# Patient Record
Sex: Female | Born: 1995 | Race: Black or African American | Hispanic: No | Marital: Single | Smoking: Never smoker
Health system: Southern US, Community
[De-identification: ages and names within clinical notes are randomized; demographics above are authoritative.]

## PROBLEM LIST (undated history)

## (undated) DIAGNOSIS — F909 Attention-deficit hyperactivity disorder, unspecified type: Secondary | ICD-10-CM

## (undated) DIAGNOSIS — Z975 Presence of (intrauterine) contraceptive device: Secondary | ICD-10-CM

## (undated) DIAGNOSIS — K219 Gastro-esophageal reflux disease without esophagitis: Secondary | ICD-10-CM

## (undated) HISTORY — DX: Attention-deficit hyperactivity disorder, unspecified type: F90.9

## (undated) HISTORY — DX: Gastro-esophageal reflux disease without esophagitis: K21.9

## (undated) HISTORY — DX: Presence of (intrauterine) contraceptive device: Z97.5

---
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
## 2013-03-06 ENCOUNTER — Encounter: Payer: Self-pay | Admitting: Family Medicine

## 2013-03-06 ENCOUNTER — Ambulatory Visit (INDEPENDENT_AMBULATORY_CARE_PROVIDER_SITE_OTHER): Payer: Medicaid Other | Admitting: Family Medicine

## 2013-03-06 VITALS — BP 128/94 | HR 97 | Ht 59.0 in | Wt 159.0 lb

## 2013-03-06 DIAGNOSIS — M542 Cervicalgia: Secondary | ICD-10-CM | POA: Insufficient documentation

## 2013-03-06 MED ORDER — NAPROXEN 500 MG PO TABS: 500.0000 mg | ORAL_TABLET | Freq: Two times a day (BID) | ORAL | Status: DC

## 2013-03-06 NOTE — Progress Notes (Signed)
Subjective:     Patient ID: Emily Hodges, female   DOB: [birth year], 17 y.o.   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 She states that her back pain began Jan 2014 and lasted for 1.5 mos and was relieved until the most recent episode for which she visits today. She describes her pain as a dull ache that is constant throughout the day, rating it an 8/10 on pain scale. The pain does not affect her daily activities in any way and has not changed in character since it began. She has tried taking ibuprofen and using heating pads with limited success. She notes that when she does not wear a sports bra her pain worsens. Additionally, a family Hx of upper back pain attributed to large breast size is reported. Since June 2013 to now she has gone from a D to DDD in cup size. Pt is particularly concerned that her back pain is related to her breast size. Pt denies any recent trauma.  Attending Note: Patient seen and examined by me together with Donnal Moat, [employer]-MS3, I have reviewed and agree with his documentation.  Patient presents today with complaint of upper back pain across both shoulders, worse when she lifts arms over head.  No recent trauma, no repetitive overuse of UEs.  Had similar pain in Jan 2014 which resolved after about 1-1/2 months, then recurred about 3 weeks ago without clear trigger.  Heat pads and ibuprofen intermittently have helped somewhat.  No radiating pain down arms; no focal shoulder pain.  She has noticed some increase in her bra size and believes that this may be contributing to the pain. JB  Review of Systems Consitutional: No fevers, HA, changes in ROM of back, neck, or upper extremities (shoulder, arms). Rest of ROS as per HPI Reviewed ROS and agree. JB    Objective:   Physical Exam Gen: No apparent acute distress. A&Ox3. Pt found sitting upright. Speaks in full sentences and answers questions appropriately.  MSK:  Normal tone throughout. Strength 5/5 in upper extremities and hand. Biceps and Triceps reflexes 2+ bilaterally. Full ROM of upper extremities and neck. Flexion of shoulders above head causes paraspinal pain in upper back. Likewise, a similar pain is elicited on flexion and extension of neck while stabilizing C7, but not with neck rotation. Testing for shoulder impingement using "thumbs-down" method with external resistance and gravity elicits back pain but not shoulder pain. Pain on palpation in the region of the traps, supra/infrascapularis and paraspinal muscles.  Neuro: CN XI intact.   EXAM: Well appearing, no apparent distress. HEENT Neck supple. Full active and passive ROM of neck (flex/extension, R/L rotation, sidebending). No point tenderness over vertebral processes in C- or T-spine;she does have tenderness along paraspinous mm along c-spine.  Full active and passive ROM of both shoulders. Full ROm internal rotation both shoulders. Negative Spurlings test bilat.  NEURO: Sensation in both UEs/hands is symmetric bilaterally. Handgrip strength is full and symmetric bilat.  Biceps reflexes 1-2+ bilat and symmetric.  EXTS: No atrophy of thenar or hypothenar mm. JB  Problem List: 1. Upper back pain    Assessment:     This is an otherwise healthy 17 yo female with a family history of upper back pain related to large breasts, experiencing upper back pain that is likely musculoskeletal in nature.    Plan:     1. Upper back pain - Diff Dx for upper  back pain for this pt includes pure MSK involvement, cervical radiculopathy, or possible shoulder impingement syndrome (SIS). Negative "thumbs down" test and full ROM without limits in daily activities makes SIS less likely. Additionally, normal reflexes, strength, and tone of upper extremities places low on differential. Because of the positive family Hx, enlargement of breasts, and pain seemingly limited to the muscles of the upper back, pt will be  treated for MSK pain. For now the pt will be treated conservatively by using Naproxen 500 mg bid for 7-10 days. Pt will f/u in 2 wks with PCP to evaluate improvement of pain. If pain persists, physical therapy and/or lidoderm could be considered as alternatives to help with pain control.

## 2013-03-06 NOTE — Patient Instructions (Addendum)
It was a pleasure to meet you today.  As we discussed, your upper back issues are likely musculoskeletal and so we will treat conservatively to improve pain.  A prescription for Naproxen was sent to your pharmacy. Take 500 mg twice daily (one in morning and one at night) w/ food for 7-10 days for pain. Discontinue all other NSAIDs (Ibuprofen, acetominophen/tylenol).  F/u 2-4 wks with your PCP, Dr. Gwenlyn Saran

## 2013-04-08 ENCOUNTER — Ambulatory Visit: Payer: Medicaid Other | Admitting: Family Medicine

## 2013-04-21 ENCOUNTER — Ambulatory Visit (INDEPENDENT_AMBULATORY_CARE_PROVIDER_SITE_OTHER): Payer: Medicaid Other | Admitting: Family Medicine

## 2013-04-21 ENCOUNTER — Encounter: Payer: Self-pay | Admitting: Family Medicine

## 2013-04-21 VITALS — BP 118/81 | HR 77 | Ht 59.0 in | Wt 162.0 lb

## 2013-04-21 DIAGNOSIS — M542 Cervicalgia: Secondary | ICD-10-CM

## 2013-04-21 DIAGNOSIS — M940 Chondrocostal junction syndrome [Tietze]: Secondary | ICD-10-CM | POA: Insufficient documentation

## 2013-04-21 DIAGNOSIS — M546 Pain in thoracic spine: Secondary | ICD-10-CM

## 2013-04-21 MED ORDER — LIDOCAINE 5 % EX PTCH: 1.0000 | MEDICATED_PATCH | CUTANEOUS | Status: DC

## 2013-04-21 MED ORDER — CYCLOBENZAPRINE HCL 10 MG PO TABS: 10.0000 mg | ORAL_TABLET | Freq: Two times a day (BID) | ORAL | Status: DC | PRN

## 2013-04-21 NOTE — Assessment & Plan Note (Signed)
Very low likelyhood of chest pain being cardiac. Likely musculoskeletal.  - flexeril for muscle spasm - reassurance and review of red flags for return.

## 2013-04-21 NOTE — Assessment & Plan Note (Addendum)
Chronic for 2 years worsening recently since increase in bra size: - trial of lidoderm patch - flexeril for muscle spasms - upper back strengthening exercises given with instructions - xray of cervical and thoracic spine for baseline imaging - discussed importance of weight loss in relation to larger breasts. She appeared motivated and gladly accepted offer to call Dr. Gerilyn Pilgrim for nutrition consult. She inquired about breast reduction surgery and I told her that weight loss needed to be attempted first. She agreed. - follow up in 1 month

## 2013-04-21 NOTE — Progress Notes (Signed)
Patient ID: Emily Hodges    DOB: May 21, 1996, 17 y.o.   MRN: [id number] --- Subjective:  Emily Hodges is a 17 y.o.female who presents for follow up on upper back pain. Her concerns are the following: - back pain: ongoing for 2 years but worst since January of this year. She noticed that her breast size increased around that time from a double D to a triple D. Pain is located in between the shoulder blaades and the middle of her back, it is a constant achy and throbbing pain. She was seen in [place] for this pain and was prescribed Naproxen which did not significantly helped. She uses a heating pad at night time that helps her go to sleep. Pain doesn't wake her up at night. She denies any numbness or tingling in arms. She denies any weakness in arms or legs. No sharp shooting pain radiating down arms. Pain is better when she wears a sports braw with better support. Heavy lifting makes the pain worst or wearing a loose fitting bra.  - chest pain: for 2 weeks. Located in middle of chest. Achy/throbbing pain, that lasts up to 1 hr and resolves on its own. Not associated with shortness of breath, palpitations or radiation of pain.    ROS: see HPI Past Medical History: reviewed and updated medications and allergies. Social History: Tobacco: none  Objective: Filed Vitals:   04/21/13 1003  BP: 118/81  Pulse: 77    Physical Examination:   General appearance - alert, well appearing, and in no distress Chest - clear to auscultation, no wheezes, rales or rhonchi, symmetric air entry Heart - normal rate, regular rhythm, normal S1, S2, no murmurs, rubs, clicks or gallops Chest wall: reproducible tenderness with palpation along chest wall Back - tenderness along the thoracic spine and lower cervical spine, tenderness along trapezius muscles bilaterally with some increased spasticity on right Normal range of movement of neck. No pain with neck movement. Normal range of motion of shoulders Normal grip strength,  biceps, triceps and deltoid strength bilaterally

## 2013-04-21 NOTE — Patient Instructions (Addendum)
I recommend that you meet with our Registered Dietitian (Nutritionist), Dr. Vickki Muff for help addressing your dietary and eating habits.  You can schedule an appointment with her by calling her directly at [phone].   Follow up in 1 month to see how the pain is doing.  If you ever have shortness of breath, feeling that your heart is racing with the chest discomfort, seek medical care.

## 2013-05-06 ENCOUNTER — Telehealth: Payer: Self-pay | Admitting: Family Medicine

## 2013-05-06 NOTE — Telephone Encounter (Signed)
Faxed prior authorization form for lidoderm patch stating that she has tried NSAID's, flexeril and icy/hot patches without significant relief of her neck pain.   Emily Hodges, PGY-3 Family Medicine Resident

## 2013-05-26 ENCOUNTER — Telehealth: Payer: Self-pay | Admitting: Family Medicine

## 2013-05-26 MED ORDER — DICLOFENAC SODIUM 1 % TD GEL: 2.0000 g | Freq: Four times a day (QID) | TRANSDERMAL | Status: DC

## 2013-05-26 NOTE — Telephone Encounter (Signed)
Received prior approval form for lidocaine patch and it appears that based on the criteria for approval, she may be denied.  Will therefore send Rx for voltaren gel.  Will route to Arlyss Repress for patient to be informed.   Marena Chancy, PGY-3 Family Medicine Resident

## 2013-05-28 NOTE — Telephone Encounter (Signed)
Called pt. LMVM  To call back. Please see Dr.Losq's message. Thanks. Emily Hodges, Emily Hodges

## 2013-06-08 ENCOUNTER — Encounter: Payer: Self-pay | Admitting: Family Medicine

## 2013-06-08 ENCOUNTER — Ambulatory Visit (INDEPENDENT_AMBULATORY_CARE_PROVIDER_SITE_OTHER): Payer: Medicaid Other | Admitting: Family Medicine

## 2013-06-08 VITALS — BP 130/87 | HR 86 | Temp 98.4°F | Ht 59.0 in | Wt 163.0 lb

## 2013-06-08 DIAGNOSIS — M549 Dorsalgia, unspecified: Secondary | ICD-10-CM

## 2013-06-08 DIAGNOSIS — M62830 Muscle spasm of back: Secondary | ICD-10-CM

## 2013-06-08 DIAGNOSIS — Z23 Encounter for immunization: Secondary | ICD-10-CM

## 2013-06-08 DIAGNOSIS — L708 Other acne: Secondary | ICD-10-CM

## 2013-06-08 DIAGNOSIS — L709 Acne, unspecified: Secondary | ICD-10-CM

## 2013-06-08 DIAGNOSIS — M5380 Other specified dorsopathies, site unspecified: Secondary | ICD-10-CM

## 2013-06-08 MED ORDER — CLINDAMYCIN PHOS-BENZOYL PEROX 1-5 % EX GEL: Freq: Two times a day (BID) | CUTANEOUS | Status: DC

## 2013-06-08 MED ORDER — LIDOCAINE 5 % EX PTCH: 1.0000 | MEDICATED_PATCH | CUTANEOUS | Status: DC

## 2013-06-08 NOTE — Patient Instructions (Signed)
For the back pain, you can get the xray at [facility] imaging. I am sending the prescription for lidoderm patch and the referral to physical therapy.

## 2013-06-09 DIAGNOSIS — M62830 Muscle spasm of back: Secondary | ICD-10-CM | POA: Insufficient documentation

## 2013-06-09 NOTE — Assessment & Plan Note (Addendum)
Muscle spasm, likely causing her pain. This has been a chronic problem. No knot that could be targeted with trigger point injection.  - continue voltaren gel for now - continue flexeril  - lidoderm patch if can get approval - thoracic and c spine xrays - start PT for strengthening and stretching. This will likely be the most beneficial modality to helping this.  - continued weight loss plan in setting of large breast that might also be contributing to the back pain.

## 2013-06-09 NOTE — Progress Notes (Signed)
Patient ID: DELORES EDELSTEIN    DOB: [birth year], 17 y.o.   MRN: [id number] --- Subjective:  Arleta is a 17 y.o.female who presents for follow up on back pain.  - back pain: located in the thoracic sine. Constant, squeezing pain. Sometimes wakes her up at night. No numbness, tingling or weakness in upper or lower extremities. She has tried the flexeril which helps but makes her drowsy. She has also tried the voltaren gel which helps alleviate the pain for a couple of hours and the pain then starts back. She has been doing stretching and strengthening exercises which help with the pain for a few minutes that the stretch is being done.  This has been an ongoing problem for 2 years. She feels like it might be getting worst.  - exercise and weight loss: she has been exercising every day, walking and doing ab exercises. She has cut out sodas completely out of diet and tries not to eat out as much.   - acne: feels like it is flaring up. Would like something for it. Has been using over the counter facial wash  ROS: see HPI Past Medical History: reviewed and updated medications and allergies. Social History: Tobacco: none  Objective: Filed Vitals:   06/08/13 1143  BP: 130/87  Pulse: 86  Temp: 98.4 F (36.9 C)    Physical Examination:   General appearance - alert, well appearing, and in no distress Back - tenderness to palpation along muscle spasm located at the right paraspinal muscles of the thoracic spine and shifting up to scapula. Some tenderness along the T spine No winged scapula.  5/5 strength in upper and lower extremities bilaterally Skin- 6 closed comedones on left cheek and 8 on right.

## 2013-06-10 DIAGNOSIS — L709 Acne, unspecified: Secondary | ICD-10-CM | POA: Insufficient documentation

## 2013-06-10 NOTE — Assessment & Plan Note (Signed)
Rx for benzaclin

## 2013-07-03 ENCOUNTER — Other Ambulatory Visit (HOSPITAL_COMMUNITY)
Admission: RE | Admit: 2013-07-03 | Discharge: 2013-07-03 | Disposition: A | Discharge status: Home / Self Care | Payer: Medicaid Other | Source: Ambulatory Visit | Attending: Family Medicine | Admitting: Family Medicine

## 2013-07-03 ENCOUNTER — Ambulatory Visit (INDEPENDENT_AMBULATORY_CARE_PROVIDER_SITE_OTHER): Payer: Medicaid Other | Admitting: Family Medicine

## 2013-07-03 ENCOUNTER — Encounter: Payer: Self-pay | Admitting: Family Medicine

## 2013-07-03 VITALS — BP 127/88 | HR 108 | Ht 59.0 in | Wt 161.0 lb

## 2013-07-03 DIAGNOSIS — Z7251 High risk heterosexual behavior: Secondary | ICD-10-CM | POA: Insufficient documentation

## 2013-07-03 DIAGNOSIS — Z113 Encounter for screening for infections with a predominantly sexual mode of transmission: Secondary | ICD-10-CM

## 2013-07-03 NOTE — Patient Instructions (Signed)
Thank you for coming in today. Today we tested for gonorrhea, chlamydia, HIV and syphilis. If any of your tests are positive I will call you on Monday. Otherwise you will get a letter in a week or so confirming that they were all negative.   Thank you,  Dr. Richarda Blade

## 2013-07-03 NOTE — Progress Notes (Signed)
  Subjective:    Patient ID: Emily Hodges, female    DOB: 1996/08/26, 17 y.o.   MRN: [id number]  HPI Patient presents for screening STI testing. Reports she has had unprotected sex only once but just wants to be sure. She denies abdominal pain, dysuria, vaginal discharge, vaginal lesions, or other STI symptoms.   Review of Systems  Gastrointestinal: Negative for nausea, vomiting and abdominal pain.  Genitourinary: Negative for dysuria, vaginal bleeding, vaginal discharge, genital sores, vaginal pain, menstrual problem, pelvic pain and dyspareunia.  All other systems reviewed and are negative.       Objective:   Physical Exam  Nursing note and vitals reviewed. Constitutional: She is oriented to person, place, and time. She appears well-developed and well-nourished. No distress.  HENT:  Head: Normocephalic and atraumatic.  Right Ear: External ear normal.  Left Ear: External ear normal.  Nose: Nose normal.  Eyes: Conjunctivae are normal. Right eye exhibits no discharge. Left eye exhibits no discharge. No scleral icterus.  Pulmonary/Chest: Effort normal.  Abdominal: Soft. She exhibits no distension. There is no tenderness.  Genitourinary: Vagina normal and uterus normal. No vaginal discharge found.  Neurological: She is alert and oriented to person, place, and time.  Skin: Skin is warm and dry. She is not diaphoretic.  Psychiatric: She has a normal mood and affect.          Assessment & Plan:

## 2013-07-03 NOTE — Assessment & Plan Note (Signed)
Unprotected sex x1, has implanon for birth control, desires STI testing - cervical gc/chlam done  - HIV/RPR - call positive results to patient's cell [phone]

## 2013-07-04 LAB — RPR

## 2013-07-04 LAB — HIV ANTIBODY (ROUTINE TESTING W REFLEX): HIV: NONREACTIVE

## 2013-07-13 ENCOUNTER — Encounter: Payer: Self-pay | Admitting: Family Medicine

## 2013-08-08 ENCOUNTER — Other Ambulatory Visit: Payer: Self-pay | Admitting: Family Medicine

## 2013-10-11 ENCOUNTER — Other Ambulatory Visit: Payer: Self-pay | Admitting: Family Medicine

## 2013-10-30 ENCOUNTER — Ambulatory Visit: Payer: Medicaid Other | Admitting: Family Medicine

## 2013-11-02 ENCOUNTER — Encounter: Payer: Self-pay | Admitting: Family Medicine

## 2013-11-02 ENCOUNTER — Ambulatory Visit (INDEPENDENT_AMBULATORY_CARE_PROVIDER_SITE_OTHER): Payer: Medicaid Other | Admitting: Family Medicine

## 2013-11-02 VITALS — BP 138/94 | HR 100 | Ht 59.0 in | Wt 171.0 lb

## 2013-11-02 DIAGNOSIS — Z30017 Encounter for initial prescription of implantable subdermal contraceptive: Secondary | ICD-10-CM

## 2013-11-02 DIAGNOSIS — Z3046 Encounter for surveillance of implantable subdermal contraceptive: Secondary | ICD-10-CM

## 2013-11-02 MED ORDER — ETONOGESTREL 68 MG ~~LOC~~ IMPL
68.0000 mg | DRUG_IMPLANT | Freq: Once | SUBCUTANEOUS | Status: AC
  Administered 2013-11-02: 68 mg via SUBCUTANEOUS

## 2013-11-03 NOTE — Progress Notes (Signed)
Patient ID: Emily Hodges    DOB: [birth year], 18 y.o.   MRN: [id number] --- Subjective:  Emily Hodges is a 18 y.o.female who presents for removal of implanon and reinsertion.  Had it inserted 3 years ago in March. She has tolerated it well and would like to get a new one re-inserted.    ROS: see HPI Past Medical History: reviewed and updated medications and allergies. Social History: Tobacco: none  Objective: Filed Vitals:   11/02/13 1439  BP: 138/94  Pulse: 100    Physical Examination:   General appearance - alert, well appearing, and in no distress Left arm: palpable implant rod approximately 10cm from medial epicondyle   Implanon or Nexplanon Removal  Patient given informed consent for removal of her Nexplanon.  Signed copy in the chart.  Time out recorded.  Implanon site identified and able to be palpated.   Area prepped in usual sterile fashon.  One cc of 1% lidocaine was used to anesthetize the area at the distal end of the implant. A small stab incision was made near implant on the distal portion.   The implanon rod was grasped using hemostats and removed without difficulty.   Rod measured at 4cm to ensure complete removal.   There was less than 3 cc blood loss. There were no complications.  The patient tolerated the procedure well and was given post procedure instructions.  Nexplanon Insertion  Indications, contraindications, side effects, complications reviewed. Area was prepped in usual manner  Implanon inserted through incision where previous implanon was removed. Followed package instructions.  Patient tolerated procedure well. Negligible blood loss.  Steri-strip used to close site, pressure bandage applied.  Provider and patient able to palpate rod.  Given aftercare instructions.

## 2013-11-03 NOTE — Assessment & Plan Note (Signed)
implanon removed and new explanon reinserted.  Red flags for return reviewed including erythema, warmth, pain or fever

## 2013-11-12 NOTE — Addendum Note (Signed)
Addended by: Lonia SkinnerLOSQ, Monalisa Bayless E on: 11/12/2013 07:14 PM   Modules accepted: Level of Service

## 2013-11-24 ENCOUNTER — Other Ambulatory Visit: Payer: Self-pay | Admitting: Family Medicine

## 2014-01-04 ENCOUNTER — Other Ambulatory Visit: Payer: Self-pay | Admitting: Family Medicine

## 2014-02-02 ENCOUNTER — Ambulatory Visit (INDEPENDENT_AMBULATORY_CARE_PROVIDER_SITE_OTHER): Payer: Medicaid Other | Admitting: Family Medicine

## 2014-02-02 ENCOUNTER — Encounter: Payer: Self-pay | Admitting: Family Medicine

## 2014-02-02 VITALS — BP 127/88 | HR 111 | Temp 98.1°F | Ht 59.0 in | Wt 167.0 lb

## 2014-02-02 DIAGNOSIS — N898 Other specified noninflammatory disorders of vagina: Secondary | ICD-10-CM

## 2014-02-02 DIAGNOSIS — R82998 Other abnormal findings in urine: Secondary | ICD-10-CM

## 2014-02-02 DIAGNOSIS — A499 Bacterial infection, unspecified: Secondary | ICD-10-CM

## 2014-02-02 DIAGNOSIS — Z309 Encounter for contraceptive management, unspecified: Secondary | ICD-10-CM

## 2014-02-02 DIAGNOSIS — B9689 Other specified bacterial agents as the cause of diseases classified elsewhere: Secondary | ICD-10-CM

## 2014-02-02 DIAGNOSIS — Z975 Presence of (intrauterine) contraceptive device: Secondary | ICD-10-CM | POA: Insufficient documentation

## 2014-02-02 DIAGNOSIS — N920 Excessive and frequent menstruation with regular cycle: Secondary | ICD-10-CM

## 2014-02-02 DIAGNOSIS — N760 Acute vaginitis: Secondary | ICD-10-CM

## 2014-02-02 DIAGNOSIS — R8290 Unspecified abnormal findings in urine: Secondary | ICD-10-CM

## 2014-02-02 DIAGNOSIS — N921 Excessive and frequent menstruation with irregular cycle: Secondary | ICD-10-CM | POA: Insufficient documentation

## 2014-02-02 LAB — POCT URINE PREGNANCY: Preg Test, Ur: NEGATIVE

## 2014-02-02 LAB — POCT URINALYSIS DIPSTICK
Bilirubin, UA: NEGATIVE
GLUCOSE UA: NEGATIVE
Ketones, UA: NEGATIVE
NITRITE UA: POSITIVE
PH UA: 7
Protein, UA: NEGATIVE
Spec Grav, UA: 1.02
UROBILINOGEN UA: 0.2

## 2014-02-02 LAB — POCT UA - MICROSCOPIC ONLY

## 2014-02-02 MED ORDER — NORGESTIMATE-ETH ESTRADIOL 0.25-35 MG-MCG PO TABS: 1.0000 | ORAL_TABLET | Freq: Every day | ORAL | Status: DC

## 2014-02-02 MED ORDER — METRONIDAZOLE 500 MG PO TABS: 500.0000 mg | ORAL_TABLET | Freq: Two times a day (BID) | ORAL | Status: DC

## 2014-02-02 NOTE — Patient Instructions (Signed)
It was a pleasure to see you today.    For the Bacterial Vaginosis I sent in a prescription for metronidazole  500mg  tablets, one tablet twice daily for 7 days.  No alcohol with this medication.  Sprintec oral contraceptive tablets, one daily for one cycle/packet, for the breakthrough bleeding.   Follow up with Dr Gwenlyn Saran in our office in the coming 3 weeks.

## 2014-02-04 NOTE — Progress Notes (Signed)
   Subjective:    Patient ID: Emily Hodges, female    DOB: 1996/04/26, 18 y.o.   MRN: [id number]  HPI Pt presents for SDA for complaint of spotting since placement of Nexplanon device in March, 2015.  Prior to this she had been receiving depo provera since 2010 and had been largely amenorrheic for that time.  She notes vaginal spotting continuously since March, changes panty liners whenever there is a stain of blood.  Denies heavy bleeding, tiredness, dizziness.  Denies other sources of bleeding. Wonders if this could be symptoms of UTI.  Denies prior history of cystitis. No dysuria or polyuria. Denies feeling of incomplete emptying with void. Denies diarrhea or loose stools, no blood in stool. Denies vaginal discharge.  She is not sexually active at the present time.  Denies tobacco or alcohol use.    Review of Systems     Objective:   Physical Exam Well appearing, no apparent distress HEENT Neck supple. Conjunctivae normal without pallor. No pallor in sublingual mucosa. MMM COR regular S1S2 EXTS Fingertips with brisk cap refill. Normal skin turgor.        Assessment & Plan:

## 2014-02-04 NOTE — Assessment & Plan Note (Signed)
Mild spotting on Nexplanon, 3 months after placement. Negative pregnancy test. UA with finding of clue cells.  No prior STI history, no vaginal discharge, not sexually active. Deferred speculum exam/cultures. Plan to treat the BV found on the urine sample today. Unlikely to have cystitis based on history and UA results. Discussed patterns of breakthrough bleeding and how in the majority of women this tapers to amenorrhea before one year on Nexplanon. Will treat with one cycle of oral contraceptive pills for now. Follow up with primary physician nearing end of that cycle.

## 2014-02-15 ENCOUNTER — Other Ambulatory Visit: Payer: Self-pay | Admitting: Family Medicine

## 2014-03-24 ENCOUNTER — Other Ambulatory Visit: Payer: Self-pay | Admitting: *Deleted

## 2014-03-24 MED ORDER — CYCLOBENZAPRINE HCL 10 MG PO TABS: ORAL_TABLET | ORAL | Status: DC

## 2014-05-11 ENCOUNTER — Other Ambulatory Visit: Payer: Self-pay | Admitting: *Deleted

## 2014-05-11 ENCOUNTER — Other Ambulatory Visit: Payer: Self-pay | Admitting: Family Medicine

## 2014-05-11 MED ORDER — CYCLOBENZAPRINE HCL 10 MG PO TABS: ORAL_TABLET | ORAL | Status: DC

## 2014-06-15 ENCOUNTER — Encounter: Payer: Self-pay | Admitting: Family Medicine

## 2014-06-15 ENCOUNTER — Ambulatory Visit
Admission: RE | Admit: 2014-06-15 | Discharge: 2014-06-15 | Disposition: A | Discharge status: Home / Self Care | Payer: Medicaid Other | Source: Ambulatory Visit | Attending: Family Medicine | Admitting: Family Medicine

## 2014-06-15 ENCOUNTER — Other Ambulatory Visit: Payer: Self-pay | Admitting: Family Medicine

## 2014-06-15 ENCOUNTER — Ambulatory Visit (INDEPENDENT_AMBULATORY_CARE_PROVIDER_SITE_OTHER): Payer: Medicaid Other | Admitting: Family Medicine

## 2014-06-15 VITALS — BP 122/81 | HR 105 | Temp 98.2°F | Ht 59.0 in | Wt 162.0 lb

## 2014-06-15 DIAGNOSIS — M79641 Pain in right hand: Secondary | ICD-10-CM

## 2014-06-15 NOTE — Assessment & Plan Note (Signed)
Acute onset right hand / wrist pain associated with swelling following traumatic injury (self-inflicted, punched wall) 5 days ago, without significant improvement - Appears structurally intact on exam, with preserved ROM, no significant edema, no loss of strength or sensation - Concern for possible metacarpal fracture given impact  Plan: 1. Ordered complete Right Hand and Right Wrist X-rays ([facility]). UPDATE - reviewed results, both Right hand and wrist X-rays read as negative, without any identified fractures, dislocations, or soft tissue swelling. 2. Placed ACE wrap for support, recommended patient may purchase OTC wrist support as well. Goal to keep 4th and 5th digits supported with ACE wrap temporarily. 3. Patient to be notified of results with no fracture, recommend continuing conservative therapy, Tylenol, NSAIDs, relative rest, ACE wrap, ice PRN swelling 4. Anticipate 1-3 week recovery, return criteria given if not improving or worsening, may benefit from Urgent Care / ED for splinting, possible re-image if worsening.

## 2014-06-15 NOTE — Progress Notes (Signed)
   Subjective:    Patient ID: Emily Hodges, female    DOB: 1996-08-25, 18 y.o.   MRN: [id number]  Patient presents for a same day appointment.  HPI  RIGHT HAND SWELLING: - Reported that she hurt her right hand on Thursday (06/10/14) and has had persistent pain. She states that she was "mad and punched the wooden molding on the wall". Admits to pain immediately after and later with some swelling, wrapped with an ACE bandage. Currently states that pain is not improving, but not getting worse. No pain without movement, otherwise has throbbing pain up to 10/10 with movement or any significant touch, localized to 4th and 5th finger knuckles, limited movement of those fingers due to pain, unable to clench fist, some pain at wrist. Not taking anything for pain. - Admits to occasional tingling in 4th and 5th (Right) fingers - Denies any recent illnesses, redness or rash, pain in other joints, weakness, numbness  I have reviewed and updated the following as appropriate: allergies and current medications  Social Hx: - Never smoker  Review of Systems  See above HPI    Objective:   Physical Exam  BP 122/81  Pulse 105  Temp(Src) 98.2 F (36.8 C) (Oral)  Ht 4\' 11"  (1.499 m)  Wt 162 lb (73.483 kg)  BMI 32.70 kg/m2  Gen - well-appearing, pleasant, cooperative, NAD MSK / Ext: Right Hand: minimal edema of dorsal aspect Right hand compared to left and no edema of Right fingers, moderate tenderness to palpation over 4th and 5th MCP joints and metacarpal region, limited active ROM 4th and 5th fingers due to pain, unable to clench grip, normal ROM other fingers. Right wrist mildly tender bilateral medial/lateral edges, no tenderness over anatomical snuff box and no forearm tenderness. Otherwise normal active ROM Right wrist and upper ext. Peripheral pulses intact +2 radial / ulnar Skin - warm, dry, no rashes Neuro - awake, alert, oriented, intact muscle strength 5/5 b/l (unable to perform R-grip  due to pain), intact distal sensation to light touch     Assessment & Plan:   See specific A&P problem list for details.

## 2014-06-15 NOTE — Patient Instructions (Signed)
Dear Delford FieldMidasia, Thank you for coming in to clinic today.  1. I am concerned that you may have a fracture in your right hand after your injury. We will send you for X-rays and call you with results. If needed, we may refer you to an orthopedic specialist. 2. For support, it would help to keep your fingers and wrist immobilized with ACE bandage or wrist splint. If needed you can go to Urgent Care or Emergency Department to get a splint made. 3. Take Tylenol for pain, also can try over the counter Ibuprofen or Aleve as needed.  Please schedule a follow-up appointment with Dr. Richarda BladeAdamo as needed if not improved.  If you have any other questions or concerns, please feel free to call the clinic to contact me. You may also schedule an earlier appointment if necessary.  However, if your symptoms get significantly worse, please go to the Emergency Department to seek immediate medical attention.  Saralyn PilarAlexander Shaylynn Nulty, DO [facility]

## 2014-07-01 ENCOUNTER — Other Ambulatory Visit: Payer: Self-pay | Admitting: *Deleted

## 2014-07-01 MED ORDER — CYCLOBENZAPRINE HCL 10 MG PO TABS: ORAL_TABLET | ORAL | Status: DC

## 2014-08-16 ENCOUNTER — Other Ambulatory Visit: Payer: Self-pay | Admitting: Family Medicine

## 2014-08-17 ENCOUNTER — Telehealth: Payer: Self-pay | Admitting: Family Medicine

## 2014-08-17 NOTE — Telephone Encounter (Signed)
Pt called because her insurance no longer covers Flexeril and wanted to know if the doctor can call in something that her insurance would cover. jw

## 2014-08-17 NOTE — Telephone Encounter (Signed)
Covering for PCP  Will ask nursing to check into insurance coverage.   Murtis SinkSam Bradshaw, MD [facility] Resident, PGY-3 08/17/2014, 5:33 PM

## 2014-08-24 NOTE — Telephone Encounter (Signed)
Spoke with [facility] pharmacist regarding Flexeril.  Checked the medicaid preferred list; effective August 27, 2014 generic form of Flexeril is on the preferred list.  Pharmacist stated they would place Rx on hold for pt until then.  Pt was made aware and stated understanding.  Clovis PuMartin, Krystle Oberman L, RN

## 2014-08-31 ENCOUNTER — Telehealth: Payer: Self-pay | Admitting: Family Medicine

## 2014-08-31 NOTE — Telephone Encounter (Signed)
Pt informed that she should check the type of medicaid.  If family planning medicaid, it only covers birth control.  If she has regular to call the pharmacy with correct ID number and type. If medicaid is still not active she should contact her case Production designer, theatre/television/filmmanager.  Pt stated understanding.  Clovis PuMartin, Tamika L, RN

## 2014-08-31 NOTE — Telephone Encounter (Signed)
[facility] to the [facility] today to pick up her Flexeril that was supposed to be on the Medicaid preferred list as of 08/27/14, but the pharmacy is still saying that Medicaid will not cover this. Can we call to see what the problem is. Myriam Jacobsonjw

## 2014-08-31 NOTE — Telephone Encounter (Signed)
Left message for pt to return nurse call.  Need to check if pt has regular medicaid or family planning medicaid.  Called [facility] to inform them that the generic form of Flexeril is preferred, pharmacist could not tell in their system what type of medicaid pt has. If pt has family planning medicaid, it will only cover birth control.   Clovis PuMartin, Tamika L, RN

## 2014-09-06 ENCOUNTER — Encounter: Payer: Self-pay | Admitting: Family Medicine

## 2014-09-06 ENCOUNTER — Ambulatory Visit (INDEPENDENT_AMBULATORY_CARE_PROVIDER_SITE_OTHER): Payer: Self-pay | Admitting: Family Medicine

## 2014-09-06 VITALS — BP 123/82 | HR 118 | Temp 98.3°F | Ht 59.0 in | Wt 159.6 lb

## 2014-09-06 DIAGNOSIS — Z3200 Encounter for pregnancy test, result unknown: Secondary | ICD-10-CM

## 2014-09-06 DIAGNOSIS — N912 Amenorrhea, unspecified: Secondary | ICD-10-CM

## 2014-09-06 LAB — POCT URINE PREGNANCY: Preg Test, Ur: NEGATIVE

## 2014-09-06 NOTE — Patient Instructions (Signed)
  Safe Sex Safe sex is about reducing the risk of giving or getting a sexually transmitted disease (STD). STDs are spread through sexual contact involving the genitals, mouth, or rectum. Some STDs can be cured and others cannot. Safe sex can also prevent unintended pregnancies.  WHAT ARE SOME SAFE SEX PRACTICES?  Limit your sexual activity to only one partner who is having sex with only you.  Talk to your partner about his or her past partners, past STDs, and drug use.  Use a condom every time you have sexual intercourse. This includes vaginal, oral, and anal sexual activity. Both females and males should wear condoms during oral sex. Only use latex or polyurethane condoms and water-based lubricants. Using petroleum-based lubricants or oils to lubricate a condom will weaken the condom and increase the chance that it will break. The condom should be in place from the beginning to the end of sexual activity. Wearing a condom reduces, but does not completely eliminate, your risk of getting or giving an STD. STDs can be spread by contact with infected body fluids and skin.  Get vaccinated for hepatitis B and HPV.  Avoid alcohol and recreational drugs, which can affect your judgment. You may forget to use a condom or participate in high-risk sex.  For females, avoid douching after sexual intercourse. Douching can spread an infection farther into the reproductive tract.  Check your body for signs of sores, blisters, rashes, or unusual discharge. See your health care provider if you notice any of these signs.  Avoid sexual contact if you have symptoms of an infection or are being treated for an STD. If you or your partner has herpes, avoid sexual contact when blisters are present. Use condoms at all other times.  If you are at risk of being infected with HIV, it is recommended that you take a prescription medicine daily to prevent HIV infection. This is called pre-exposure prophylaxis (PrEP). You are  considered at risk if:  You are a man who has sex with other men (MSM).  You are a heterosexual man or woman who is sexually active with more than one partner.  You take drugs by injection.  You are sexually active with a partner who has HIV.  Talk with your health care provider about whether you are at high risk of being infected with HIV. If you choose to begin PrEP, you should first be tested for HIV. You should then be tested every 3 months for as long as you are taking PrEP.  See your health care provider for regular screenings, exams, and tests for other STDs. Before having sex with a new partner, each of you should be screened for STDs and should talk about the results with each other. WHAT ARE THE BENEFITS OF SAFE SEX?   There is less chance of getting or giving an STD.  You can prevent unwanted or unintended pregnancies.  By discussing safe sex concerns with your partner, you may increase feelings of intimacy, comfort, trust, and honesty between the two of you. Document Released: 09/20/2004 Document Revised: 12/28/2013 Document Reviewed: 02/04/2012 [facility] Patient Information 2015 [place]. This information is not intended to replace advice given to you by your health care provider. Make sure you discuss any questions you have with your health care provider. Your pregnancy test was negative today.

## 2014-09-06 NOTE — Progress Notes (Signed)
   Subjective:    Patient ID: Emily Hodges, female    DOB: 05/11/1996, 19 y.o.   MRN: [id number]  HPI  Amenorrhea: Patient presents to same-day clinic today with amenorrhea. She had the Nexplanon placed in March and has not had a period since. She has had a headache with intermittent nausea for 2 weeks, and has concerns for pregnancy today. She denies any breast tenderness, fever or abdominal pain. She has a history of migraines.  Nonsmoker Past Medical History  Diagnosis Date  . GERD (gastroesophageal reflux disease)   . Implanon in place   . ADHD (attention deficit hyperactivity disorder)    No Known Allergies   Review of Systems Per hPI    Objective:   Physical Exam BP 123/82 mmHg  Pulse 118  Temp(Src) 98.3 F (36.8 C) (Oral)  Ht 4\' 11"  (1.499 m)  Wt 159 lb 9.6 oz (72.394 kg)  BMI 32.22 kg/m2 Gen: Pleasant, African-American female, well-developed, well-nourished, nontoxic in appearance, alert, oriented 3 Abdomen: Soft, nontender, nondistended, no masses. BS present       Assessment & Plan:

## 2014-09-06 NOTE — Assessment & Plan Note (Signed)
pregnancy test negative today, discussed and counseled patient on Nexplanon device and irregular bleeding patterns. AVS safe sex Follow-up as needed

## 2014-10-06 ENCOUNTER — Ambulatory Visit (INDEPENDENT_AMBULATORY_CARE_PROVIDER_SITE_OTHER): Payer: Self-pay | Admitting: Family Medicine

## 2014-10-06 ENCOUNTER — Encounter: Payer: Self-pay | Admitting: Family Medicine

## 2014-10-06 VITALS — BP 133/95 | HR 84 | Temp 98.2°F | Ht 59.0 in | Wt 161.9 lb

## 2014-10-06 DIAGNOSIS — G43909 Migraine, unspecified, not intractable, without status migrainosus: Secondary | ICD-10-CM | POA: Insufficient documentation

## 2014-10-06 DIAGNOSIS — Z23 Encounter for immunization: Secondary | ICD-10-CM

## 2014-10-06 DIAGNOSIS — R5383 Other fatigue: Secondary | ICD-10-CM

## 2014-10-06 DIAGNOSIS — G43809 Other migraine, not intractable, without status migrainosus: Secondary | ICD-10-CM

## 2014-10-06 LAB — CBC
HEMATOCRIT: 41.5 % (ref 36.0–46.0)
HEMOGLOBIN: 13.9 g/dL (ref 12.0–15.0)
MCH: 25.8 pg — ABNORMAL LOW (ref 26.0–34.0)
MCHC: 33.5 g/dL (ref 30.0–36.0)
MCV: 77 fL — AB (ref 78.0–100.0)
MPV: 9.3 fL (ref 8.6–12.4)
Platelets: 264 10*3/uL (ref 150–400)
RBC: 5.39 MIL/uL — ABNORMAL HIGH (ref 3.87–5.11)
RDW: 14.9 % (ref 11.5–15.5)
WBC: 7.1 10*3/uL (ref 4.0–10.5)

## 2014-10-06 LAB — BASIC METABOLIC PANEL
BUN: 6 mg/dL (ref 6–23)
CO2: 24 mEq/L (ref 19–32)
Calcium: 10.2 mg/dL (ref 8.4–10.5)
Chloride: 105 mEq/L (ref 96–112)
Creat: 0.72 mg/dL (ref 0.50–1.10)
Glucose, Bld: 91 mg/dL (ref 70–99)
POTASSIUM: 4.8 meq/L (ref 3.5–5.3)
SODIUM: 141 meq/L (ref 135–145)

## 2014-10-06 MED ORDER — PROPRANOLOL HCL 40 MG PO TABS: 20.0000 mg | ORAL_TABLET | Freq: Two times a day (BID) | ORAL | Status: DC

## 2014-10-06 MED ORDER — SUMATRIPTAN SUCCINATE 50 MG PO TABS: ORAL_TABLET | ORAL | Status: DC

## 2014-10-06 NOTE — Patient Instructions (Signed)
It was nice to see you today.  I have started you on 2 medications for migraines.  Take the propranolol twice daily.  Use the Imitrex as needed for abortive treatment.  You'll receive a letter in the mail or phone call regarding your laboratory results.  Follow closely with her primary provider  Take care,  Dr. Adriana Simasook.

## 2014-10-06 NOTE — Assessment & Plan Note (Addendum)
History meets diagnostic criteria for migraine. Discussed prophylaxis and abortive treatment patient and she elected to proceed with both. Treating with propranolol for prophylaxis and Imitrex for abortive treatment.  Advised patient to keep a headache diary to help determine potential triggers.

## 2014-10-06 NOTE — Progress Notes (Signed)
   Subjective:    Patient ID: Emily Hodges, female    DOB: Dec 05, 1995, 19 y.o.   MRN: [id number]  HPI 19 year old female presents for same day appointment with complaints of headache.  1) Headache  Onset: She began having headaches approximately 2 months ago. They're occurring daily.   Location: Diffuse/all over.  Quality: Described as throbbing in character. Pain is severe (goes up to 10/10 in severity).  Frequency: Daily. Last for hours (she states "most of the day").  Precipitating factors: Unclear  Prior treatment: Excedrin, Heating pad; this provided some relief.   Associated Symptoms Nausea/vomiting: Patient endorses nausea but denies vomiting. Photophobia/phonophobia: Patient reports photophobia. Tearing of eyes: no  Sinus pain/pressure: no  Family hx migraine: yes. Personal stressors: Patient denies.  Red Flags Fever: no  Neck pain/stiffness: no  Vision/speech/swallow/hearing difficulty: no  Focal weakness/numbness: no  Altered mental status: no  Trauma: no  Anticoagulant use: no  H/o cancer/HIV/Pregnancy: no   Review of Systems Per HPI    Objective:   Physical Exam Filed Vitals:   10/06/14 1103  BP: 133/95  Pulse: 84  Temp: 98.2 F (36.8 C)  Exam: General: well appearing obese female. Appears fatigued. Cardiovascular: RRR. No murmurs, rubs, or gallops. Respiratory: CTAB. No rales, rhonchi, or wheeze. Abdomen: soft, nontender, nondistended. Neuro: Cranial nerves II through XII intact. Sensation grossly intact. Muscle strength normal throughout. PERRLA. Attempted to visualize optic disks but could not secondary to photophobia.     Assessment & Plan:  See Problem List

## 2014-10-06 NOTE — Assessment & Plan Note (Signed)
Patient endorsing fatigue today. She reports approximately 2 month history of this. Will start workup and have patient follow-up with PCP.  CBC, BMP and TSH obtained today.

## 2014-10-07 ENCOUNTER — Encounter: Payer: Self-pay | Admitting: Family Medicine

## 2014-10-07 LAB — TSH: TSH: 0.573 u[IU]/mL (ref 0.350–4.500)

## 2015-04-19 ENCOUNTER — Other Ambulatory Visit: Payer: Self-pay | Admitting: Family Medicine

## 2015-04-19 DIAGNOSIS — Z3041 Encounter for surveillance of contraceptive pills: Secondary | ICD-10-CM

## 2016-05-25 IMAGING — CR DG WRIST COMPLETE 3+V*R*
4 series · 4 of 4 positions shown · non-contrast
Comparison: None.

CLINICAL DATA: Medial and lateral right wrist swelling, pain, and
hand pain after punching a wall at home 5 days ago. Initial
encounter.

EXAM:
RIGHT WRIST - COMPLETE 3+ VIEW

[x wrist pa right]
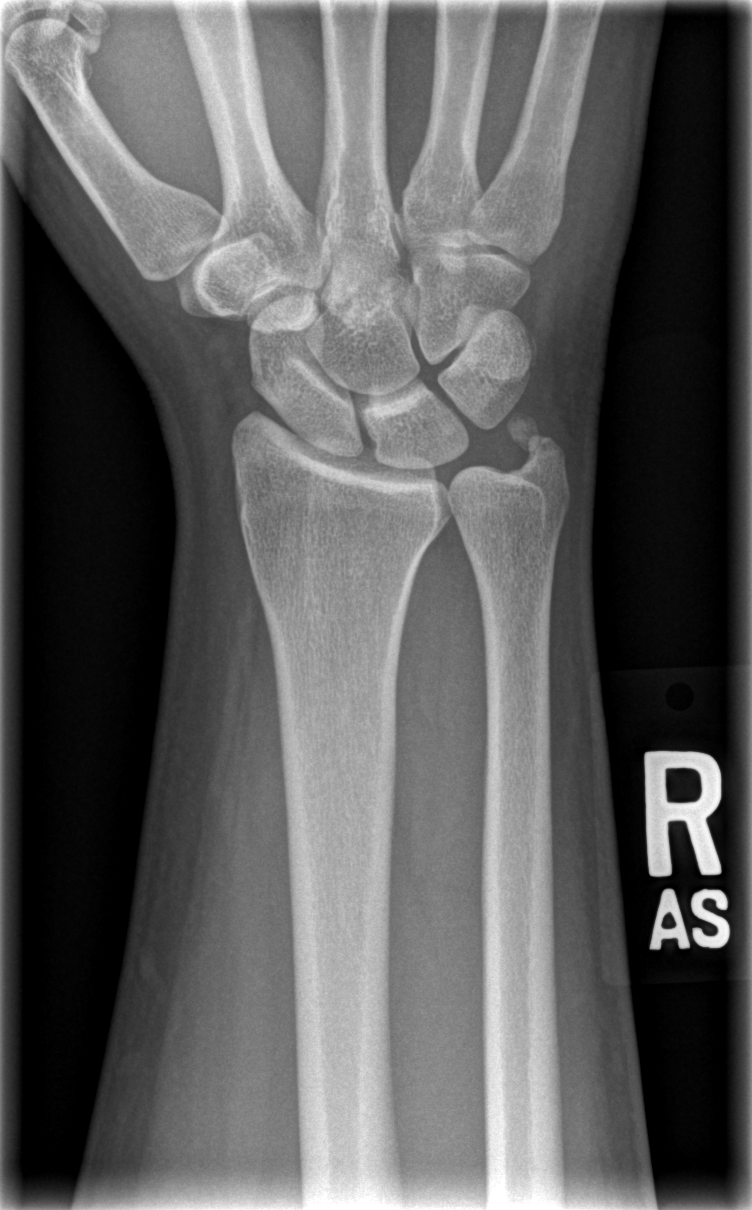

[x wrist obl right]
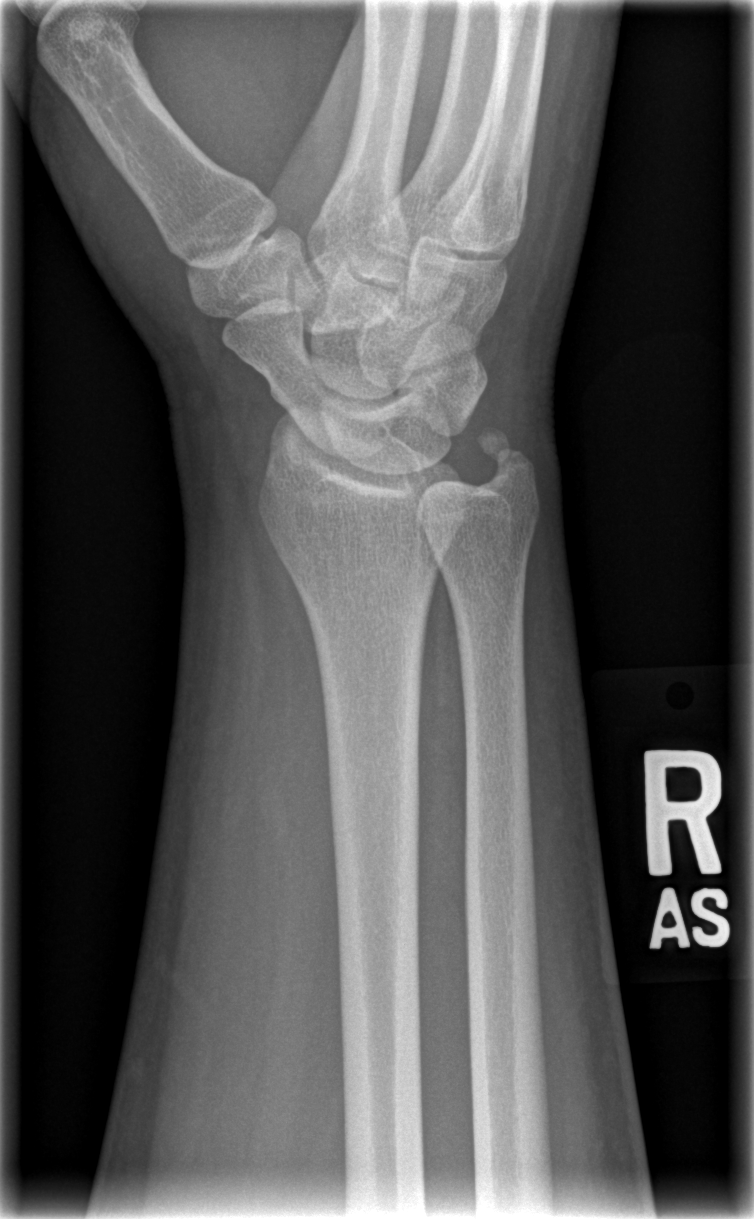

[x wrist lat right]
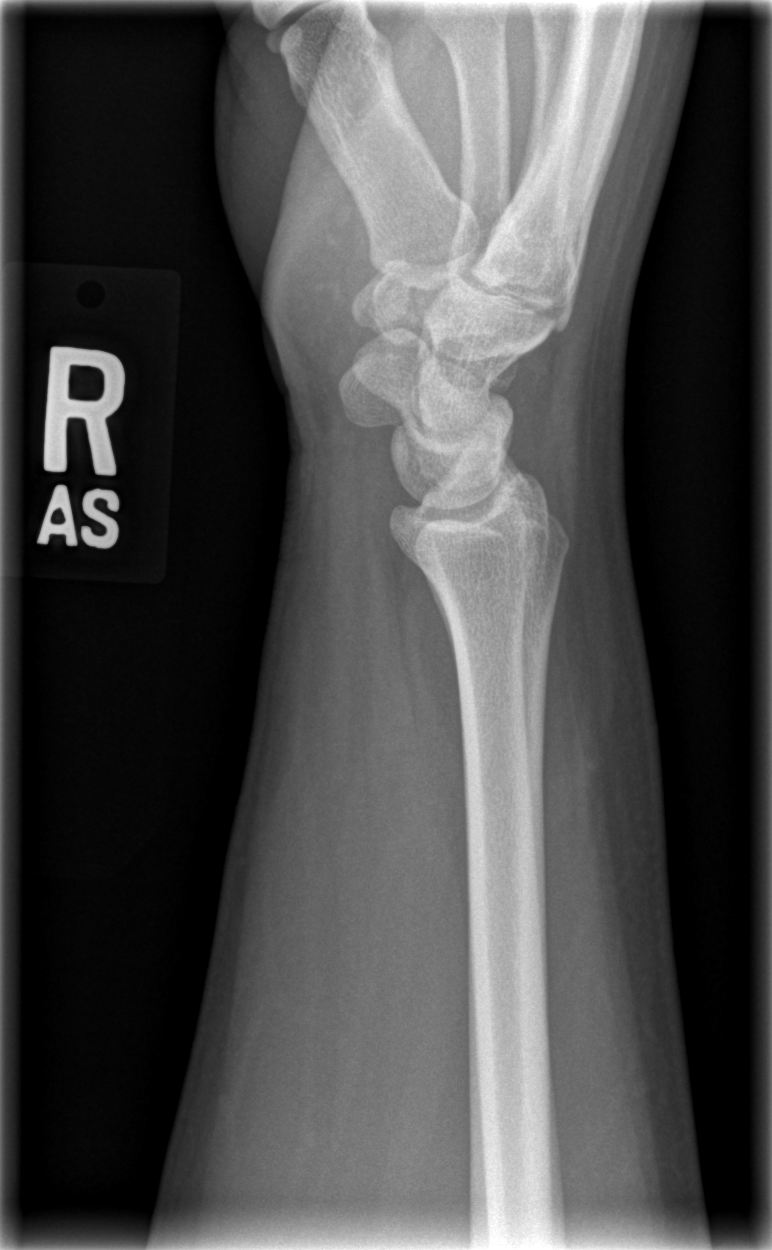

[x navicular]
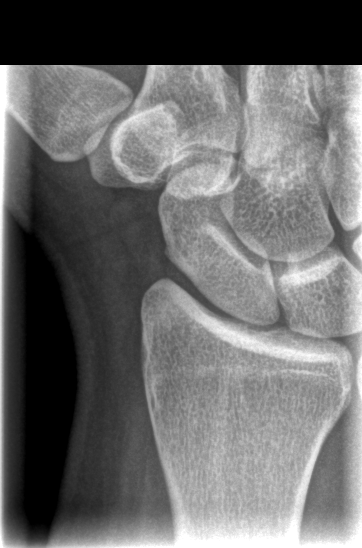

[4 of 4 positions shown; findings below may reference images not displayed]

FINDINGS: Small, well corticated ossicle adjacent to the ulnar styloid process
may be developmental or reflect remote trauma. No acute fracture or
dislocation is identified. Joint spaces are preserved. Bone
mineralization appears normal. No focal soft tissue abnormality is
identified.
IMPRESSION: No acute osseous abnormality.

## 2016-08-31 ENCOUNTER — Encounter: Payer: Self-pay | Admitting: Internal Medicine

## 2016-08-31 ENCOUNTER — Ambulatory Visit (INDEPENDENT_AMBULATORY_CARE_PROVIDER_SITE_OTHER): Payer: Self-pay | Admitting: Internal Medicine

## 2016-08-31 VITALS — BP 122/86 | HR 92 | Temp 98.6°F | Wt 167.0 lb

## 2016-08-31 DIAGNOSIS — Z3041 Encounter for surveillance of contraceptive pills: Secondary | ICD-10-CM

## 2016-08-31 DIAGNOSIS — N3001 Acute cystitis with hematuria: Secondary | ICD-10-CM

## 2016-08-31 DIAGNOSIS — R300 Dysuria: Secondary | ICD-10-CM | POA: Insufficient documentation

## 2016-08-31 DIAGNOSIS — N946 Dysmenorrhea, unspecified: Secondary | ICD-10-CM | POA: Insufficient documentation

## 2016-08-31 LAB — POCT URINALYSIS DIPSTICK
Bilirubin, UA: NEGATIVE
Glucose, UA: NEGATIVE
Ketones, UA: NEGATIVE
Nitrite, UA: POSITIVE
Spec Grav, UA: 1.01
Urobilinogen, UA: 0.2
pH, UA: 6.5

## 2016-08-31 LAB — POCT UA - MICROSCOPIC ONLY

## 2016-08-31 MED ORDER — PHENAZOPYRIDINE HCL 100 MG PO TABS: 100.0000 mg | ORAL_TABLET | Freq: Three times a day (TID) | ORAL | 0 refills | Status: DC | PRN

## 2016-08-31 MED ORDER — NORGESTIMATE-ETH ESTRADIOL 0.25-35 MG-MCG PO TABS: 1.0000 | ORAL_TABLET | Freq: Every day | ORAL | 1 refills | Status: DC

## 2016-08-31 MED ORDER — SULFAMETHOXAZOLE-TRIMETHOPRIM 800-160 MG PO TABS: 1.0000 | ORAL_TABLET | Freq: Two times a day (BID) | ORAL | 0 refills | Status: DC

## 2016-08-31 NOTE — Assessment & Plan Note (Signed)
Painful menstrual cycle lasting for past two weeks despite Implanon. Patient requesting refill of Sprintec which she was previously taking for this problem.  - Refill Sprintec today, however patient need to f/u with PCP for this problem - Patient due to have current Implanon removed in two months (10/2016) and would like new Nexplanon inserted at that time

## 2016-08-31 NOTE — Progress Notes (Signed)
   Subjective:   Patient: Emily Hodges       Birthdate: 1995/09/26       MRN: [id number]      HPI  Emily Hodges is a 21 y.o. female presenting for concern for UTI and prolonged menstrual cycle.   Dysuria Began three days ago. Also with increased urinary frequency. Unsure of hematuria, as patient is currently menstruating. Also endorses flank pain. Denies fevers, chills, abdominal pain, nausea, vomiting. Has taken Azeo but this has not improved her pain.   Prolonged menstrual cycle Patient has Implanon placed 10/2013. She was previously also prescribed Sprintec by Dr. Richarda Hodges to prevent menstrual bleeding, however the patient stopped taking this a while ago as she had not had a period at all in multiple months. She began bleeding two weeks ago, and continues to bleed today. Also endorsing painful menstrual cramps. She would like to have another prescription for Sprintec. She is due to have her current Implanon removed in two months. She would like to have another one placed at the same time.   Smoking status reviewed.   Review of Systems See HPI.     Objective:  Physical Exam  Constitutional: She is oriented to person, place, and time and well-developed, well-nourished, and in no distress.  HENT:  Head: Normocephalic and atraumatic.  Eyes: Conjunctivae and EOM are normal. Right eye exhibits no discharge. Left eye exhibits no discharge.  Pulmonary/Chest: Effort normal. No respiratory distress.  Abdominal: Soft. Bowel sounds are normal. She exhibits no distension. There is no tenderness.  Negative CVA tenderness bilaterally  Neurological: She is alert and oriented to person, place, and time.  Skin: Skin is warm and dry.  Psychiatric: Affect and judgment normal.      Assessment & Plan:  Dysmenorrhea Painful menstrual cycle lasting for past two weeks despite Implanon. Patient requesting refill of Sprintec which she was previously taking for this problem.  - Refill Sprintec  today, however patient need to f/u with PCP for this problem - Patient due to have current Implanon removed in two months (10/2016) and would like new Nexplanon inserted at that time  UTI (urinary tract infection) UA with large hemoglobin (current menstruation likely contributing), positive nitrites and trace leuks. Symptoms of dysuria, increased urinary frequency, and flank pain also consistent. Afebrile on exam with unremarkable physical exam, including negative CVA tenderness.  - Bactrim BID x3d - Pyridium TID PRN - Return if no improvement in 3d or if symptoms worsen    Tarri AbernethyAbigail J Rayvon Brandvold, MD, MPH PGY-2 [facility] Pager [phone]

## 2016-08-31 NOTE — Assessment & Plan Note (Signed)
UA with large hemoglobin (current menstruation likely contributing), positive nitrites and trace leuks. Symptoms of dysuria, increased urinary frequency, and flank pain also consistent. Afebrile on exam with unremarkable physical exam, including negative CVA tenderness.  - Bactrim BID x3d - Pyridium TID PRN - Return if no improvement in 3d or if symptoms worsen

## 2016-08-31 NOTE — Patient Instructions (Addendum)
It was nice meeting you today Emily Hodges!  For your urinary tract infection, please begin taking the antibiotic Bactrim twice a day for the next six days. You can take one tablet as soon as you pick it up today, and one tonight. You can also take pyridium as needed for pain. This will numb your bladder to help with the discomfort you are experiencing with urination.   I have also sent in a refill of your Sprintec.   If you are not feeling any better by the time you have completed the antibiotics, or if you develop abdominal pain or vomiting, please call the clinic or go to the emergency room.   If you have any questions or concerns, please feel free to call the clinic.   Be well,  Dr. Natale MilchLancaster

## 2016-11-13 ENCOUNTER — Encounter: Payer: Self-pay | Admitting: Internal Medicine

## 2016-11-13 ENCOUNTER — Ambulatory Visit (INDEPENDENT_AMBULATORY_CARE_PROVIDER_SITE_OTHER): Payer: Medicaid Other | Admitting: Internal Medicine

## 2016-11-13 VITALS — BP 110/80 | HR 89 | Temp 99.2°F | Wt 161.0 lb

## 2016-11-13 DIAGNOSIS — Z30019 Encounter for initial prescription of contraceptives, unspecified: Secondary | ICD-10-CM | POA: Diagnosis not present

## 2016-11-13 DIAGNOSIS — Z308 Encounter for other contraceptive management: Secondary | ICD-10-CM | POA: Diagnosis not present

## 2016-11-13 DIAGNOSIS — Z3046 Encounter for surveillance of implantable subdermal contraceptive: Secondary | ICD-10-CM | POA: Diagnosis not present

## 2016-11-13 LAB — POCT URINE PREGNANCY: PREG TEST UR: NEGATIVE

## 2016-11-13 MED ORDER — ETONOGESTREL 68 MG ~~LOC~~ IMPL
68.0000 mg | DRUG_IMPLANT | Freq: Once | SUBCUTANEOUS | Status: AC
  Administered 2016-11-13: 68 mg via SUBCUTANEOUS

## 2016-11-13 NOTE — Patient Instructions (Signed)
Ms. Emily Hodges,  Thank you for coming in today.  You may have bruising around site of nexplanon insertion. You can take the pressure bandage off tomorrow. Keep the site covered for the next few days -- you can use a bandaid if the strips fall off.  If you have pain, swelling, redness let us know, this could be a sign of infection.   Please use condoms for the next 7 days to prevent pregnancy.  I would try tums to see if it improves your stomach symptoms.  Best, Dr. Sampson GoonFitzgerald   Food Choices for Gastroesophageal Reflux Disease, Adult When you have gastroesophageal reflux disease (GERD), the foods you eat and your eating habits are very important. Choosing the right foods can help ease your discomfort. What guidelines do I need to follow?  Choose fruits, vegetables, whole grains, and low-fat dairy products.  Choose low-fat meat, fish, and poultry.  Limit fats such as oils, salad dressings, butter, nuts, and avocado.  Keep a food diary. This helps you identify foods that cause symptoms.  Avoid foods that cause symptoms. These may be different for everyone.  Eat small meals often instead of 3 large meals a day.  Eat your meals slowly, in a place where you are relaxed.  Limit fried foods.  Cook foods using methods other than frying.  Avoid drinking alcohol.  Avoid drinking large amounts of liquids with your meals.  Avoid bending over or lying down until 2-3 hours after eating. What foods are not recommended? These are some foods and drinks that may make your symptoms worse: Vegetables  Tomatoes. Tomato juice. Tomato and spaghetti sauce. Chili peppers. Onion and garlic. Horseradish. Fruits  Oranges, grapefruit, and lemon (fruit and juice). Meats  High-fat meats, fish, and poultry. This includes hot dogs, ribs, ham, sausage, salami, and bacon. Dairy  Whole milk and chocolate milk. Sour cream. Cream. Butter. Ice cream. Cream cheese. Drinks  Coffee and tea. Bubbly  (carbonated) drinks or energy drinks. Condiments  Hot sauce. Barbecue sauce. Sweets/Desserts  Chocolate and cocoa. Donuts. Peppermint and spearmint. Fats and Oils  High-fat foods. This includes [place] fries and potato chips. Other  Vinegar. Strong spices. This includes black pepper, white pepper, red pepper, cayenne, curry powder, cloves, ginger, and chili powder. The items listed above may not be a complete list of foods and drinks to avoid. Contact your dietitian for more information.  This information is not intended to replace advice given to you by your health care provider. Make sure you discuss any questions you have with your health care provider. Document Released: 02/12/2012 Document Revised: 01/19/2016 Document Reviewed: 06/17/2013 Elsevier Interactive Patient Education  2017 [employer]

## 2016-11-13 NOTE — Addendum Note (Signed)
Addended by: Jone BasemanFLEEGER, JESSICA D on: 11/13/2016 05:35 PM   Modules accepted: Orders

## 2016-11-13 NOTE — Progress Notes (Signed)
[facility] Progress Note  Subjective:  Emily Hodges is a 21 y.o. female here for Nexplanon removal and insertion. Has not yet had initial pap smear. Last had sex 3 days ago. Has not had a period for the last 2 months but has been taking sprintec prn to help with menstrual cramping/bleeding. In general, however, she has had little bleeding with nexplanon. Current nexplanon is her 2nd. She reports family history of blood clots (aunt and grandmother) in setting of cancer. Patient does not smoke.   ROS: No abdominal pain, no dyspnea  No Known Allergies  Objective: Blood pressure 110/80, pulse 89, temperature 99.2 F (37.3 C), temperature source Oral, weight 161 lb (73 kg), SpO2 98 %. Constitutional: Well-appearing female, in NAD Pulmonary/Chest: Effort normal and breath sounds normal. No respiratory distress.  Musculoskeletal: Negative Homan's sign. No leg pain with palpation.  Skin: Skin is warm and dry. Nexplanon easily palpable under skin of left upper arm with small horizontal white scar. Vitals reviewed  POCT urine pregnancy     Status: Normal   Collection Time: 11/13/16  8:52 AM  Result Value Ref Range   Preg Test, Ur Negative Negative   Assessment/Plan:  Nexplanon Removal and Insertion  Patient was given informed consent for removal of her Implanon and insertion of Nexplanon.  Patient does understand that irregular bleeding is a very common side effect of this medication. She was advised to have backup contraception for one week after replacement of the implant. Pregnancy test in clinic today was negative.  Appropriate time out taken. Implanon site identified. Area prepped in usual sterile fashon. One ml of 1% lidocaine was used to anesthetize the area at the distal end of the implant. A small stab incision was made right beside the implant on the distal portion. The Nexplanon rod was grasped using hemostats and removed without difficulty. There was minimal blood  loss. There were no complications. Area was then injected with 3 ml of 1 % lidocaine. Nexplanon removed from packaging, Device confirmed in needle, then inserted full length of needle and withdrawn per handbook instructions. Nexplanon was able to palpated in the patient's arm; patient palpated the insert herself.  There was minimal blood loss. Patient insertion site covered with guaze and a pressure bandage to reduce any bruising. The patient tolerated the procedure well and was given post procedure instructions.   Follow-up for well woman exam -- due for pap smear.   Patient also mentioned concern for GERD. Recommended trying tums and following up if no improvement. Gave handout on Food Choices for GERD.  Emily GobbleHillary Zabria Liss, MD [facility], PGY-2

## 2017-08-23 ENCOUNTER — Other Ambulatory Visit: Payer: Self-pay

## 2017-08-23 ENCOUNTER — Encounter (HOSPITAL_COMMUNITY): Payer: Self-pay | Admitting: Emergency Medicine

## 2017-08-23 ENCOUNTER — Emergency Department (HOSPITAL_COMMUNITY)
Admission: EM | Admit: 2017-08-23 | Discharge: 2017-08-24 | Disposition: A | Discharge status: Home / Self Care | Payer: Self-pay | Attending: Emergency Medicine | Admitting: Emergency Medicine

## 2017-08-23 DIAGNOSIS — L0501 Pilonidal cyst with abscess: Secondary | ICD-10-CM | POA: Insufficient documentation

## 2017-08-23 DIAGNOSIS — Z79899 Other long term (current) drug therapy: Secondary | ICD-10-CM | POA: Insufficient documentation

## 2017-08-23 NOTE — ED Triage Notes (Signed)
Patient complaining of an abscess on her bottom. Patient states that it is getting bigger and is painful.

## 2017-08-24 MED ORDER — SULFAMETHOXAZOLE-TRIMETHOPRIM 800-160 MG PO TABS: 1.0000 | ORAL_TABLET | Freq: Two times a day (BID) | ORAL | 0 refills | Status: AC

## 2017-08-24 MED ORDER — LIDOCAINE-EPINEPHRINE (PF) 2 %-1:200000 IJ SOLN
10.0000 mL | Freq: Once | INTRAMUSCULAR | Status: AC
  Administered 2017-08-24: 10 mL
  Filled 2017-08-24: qty 20

## 2017-08-24 MED ORDER — OXYCODONE-ACETAMINOPHEN 5-325 MG PO TABS: 1.0000 | ORAL_TABLET | ORAL | 0 refills | Status: DC | PRN

## 2017-08-24 MED ORDER — IBUPROFEN 600 MG PO TABS: 600.0000 mg | ORAL_TABLET | Freq: Four times a day (QID) | ORAL | 0 refills | Status: DC | PRN

## 2017-08-24 MED ORDER — OXYCODONE-ACETAMINOPHEN 5-325 MG PO TABS
1.0000 | ORAL_TABLET | Freq: Once | ORAL | Status: AC
  Administered 2017-08-24: 1 via ORAL
  Filled 2017-08-24: qty 1

## 2017-08-24 NOTE — ED Provider Notes (Signed)
[facility]-EMERGENCY DEPT Provider Note   CSN: [id number] Arrival date & time: 08/23/17  1906     History   Chief Complaint Chief Complaint  Patient presents with  . Abscess    HPI Emily Hodges is a 21 y.o. female.  Patient presents with recurrent painful swelling to lower back, previously diagnosed as an abscess. No fever, nausea. Symptoms started 2 days ago. No other complaint.   The history is provided by the patient, a relative and a parent. No language interpreter was used.  Abscess  Associated symptoms: no fever and no nausea     Past Medical History:  Diagnosis Date  . ADHD (attention deficit hyperactivity disorder)   . GERD (gastroesophageal reflux disease)   . Implanon in place     Patient Active Problem List   Diagnosis Date Noted  . Dysmenorrhea 08/31/2016  . Dysuria 08/31/2016  . Fatigue 10/06/2014  . Migraine 10/06/2014  . Amenorrhea 09/06/2014  . High risk sexual behavior 07/03/2013  . Acne 06/10/2013  . Pilonidal cyst with abscess 10/02/2012  . UTI (urinary tract infection) 02/13/2011  . Insertion of Implanon 11/17/2010  . ADHD 09/22/2009    History reviewed. No pertinent surgical history.  OB History    No data available       Home Medications    Prior to Admission medications   Medication Sig Start Date End Date Taking? Authorizing Provider  clindamycin-benzoyl peroxide (BENZACLIN) gel Apply topically 2 (two) times daily. 06/08/13   Losq, Leafy KindleStephanie E, MD  cyclobenzaprine (FLEXERIL) 10 MG tablet TAKE 1 TABLET BY MOUTH TWICE A DAY AS NEEDED FOR MUSCLE SPASMS (DONT DRIVE RIGHT AFTER TAKING) 08/16/14   Abram SanderAdamo, Elena M, MD  diclofenac sodium (VOLTAREN) 1 % GEL Apply 2 g topically 4 (four) times daily. 05/26/13   Losq, Leafy KindleStephanie E, MD  etonogestrel (IMPLANON) 68 MG IMPL implant Inject 1 each (68 mg total) into the skin once. 10/26/10   Abram SanderAdamo, Elena M, MD  lidocaine (LIDODERM) 5 % Place 1 patch onto the skin daily.  Remove & Discard patch within 12 hours or as directed by MD 06/08/13   Losq, Leafy KindleStephanie E, MD  norgestimate-ethinyl estradiol (SPRINTEC 28) 0.25-35 MG-MCG tablet Take 1 tablet by mouth daily. 08/31/16   Marquette SaaLancaster, Abigail Joseph, MD  phenazopyridine (PYRIDIUM) 100 MG tablet Take 1 tablet (100 mg total) by mouth 3 (three) times daily as needed for pain. 08/31/16   Marquette SaaLancaster, Abigail Joseph, MD  propranolol (INDERAL) 40 MG tablet Take 0.5 tablets (20 mg total) by mouth 2 (two) times daily. 10/06/14   Tommie Samsook, Jayce G, DO  sulfamethoxazole-trimethoprim (BACTRIM DS,SEPTRA DS) 800-160 MG tablet Take 1 tablet by mouth 2 (two) times daily. 08/31/16   Marquette SaaLancaster, Abigail Joseph, MD  SUMAtriptan (IMITREX) 50 MG tablet Take 1 tablet with onset of headache. May repeat dosing x 1 in 2 hours if headache persists or recurs. 10/06/14   Tommie Samsook, Jayce G, DO    Family History History reviewed. No pertinent family history.  Social History Social History   Tobacco Use  . Smoking status: Never Smoker  . Smokeless tobacco: Never Used  Substance Use Topics  . Alcohol use: Not on file  . Drug use: Not on file     Allergies   Patient has no known allergies.   Review of Systems Review of Systems  Constitutional: Negative for chills and fever.  Gastrointestinal: Negative.  Negative for nausea.  Musculoskeletal: Negative.   Skin:  C/O Abscess.  Neurological: Negative.  Negative for weakness.     Physical Exam Updated Vital Signs BP 122/88   Pulse (!) 119   Temp 98.8 F (37.1 C) (Oral)   Resp 16   Ht 4\' 11"  (1.499 m)   Wt 68.9 kg (152 lb)   SpO2 99%   BMI 30.70 kg/m   Physical Exam  Constitutional: She is oriented to person, place, and time. She appears well-developed and well-nourished.  Neck: Normal range of motion.  Pulmonary/Chest: Effort normal.  Neurological: She is alert and oriented to person, place, and time.  Skin: Skin is warm and dry.  Large area of significantly tender swelling to  pilonidal back with fluctuance c/w pilonidal abscess.      ED Treatments / Results  Labs (all labs ordered are listed, but only abnormal results are displayed) Labs Reviewed - No data to display  EKG  EKG Interpretation None       Radiology No results found.  Procedures .Marland Kitchen.Incision and Drainage Date/Time: 08/24/2017 2:09 AM Performed by: Elpidio AnisUpstill, Nakeesha Bowler, PA-C Authorized by: Elpidio AnisUpstill, Lekeshia Kram, PA-C   Consent:    Consent obtained:  Verbal   Consent given by:  Patient and parent Location:    Type:  Abscess   Location:  Trunk   Trunk location:  Back Pre-procedure details:    Skin preparation:  Betadine Anesthesia (see MAR for exact dosages):    Anesthesia method:  None Procedure type:    Complexity:  Simple Procedure details:    Needle aspiration: no     Incision types:  Single straight   Incision depth:  Submucosal   Scalpel blade:  11   Wound management:  Probed and deloculated, irrigated with saline and extensive cleaning   Drainage:  Bloody and purulent   Drainage amount:  Moderate   Wound treatment:  Wound left open   Packing materials:  None Post-procedure details:    Patient tolerance of procedure:  Tolerated well, no immediate complications   (including critical care time)  Medications Ordered in ED Medications  lidocaine-EPINEPHrine (XYLOCAINE W/EPI) 2 %-1:200000 (PF) injection 10 mL (10 mLs Infiltration Given 08/24/17 0203)  oxyCODONE-acetaminophen (PERCOCET/ROXICET) 5-325 MG per tablet 1 tablet (1 tablet Oral Given 08/24/17 0203)     Initial Impression / Assessment and Plan / ED Course  I have reviewed the triage vital signs and the nursing notes.  Pertinent labs & imaging results that were available during my care of the patient were reviewed by me and considered in my medical decision making (see chart for details).     Patient has uncomplicated pilonidal abscess, I&D'd as per above note. This is her second recurrence - will refer to  surgery.  Final Clinical Impressions(s) / ED Diagnoses   Final diagnoses:  None   1. Recurrent pilonidal abscess  ED Discharge Orders    None       Danne HarborUpstill, Terrion Gencarelli, PA-C 08/24/17 0210    Palumbo, April, MD 08/24/17 [phone]

## 2017-08-28 ENCOUNTER — Ambulatory Visit: Payer: Self-pay | Admitting: Internal Medicine

## 2018-02-10 ENCOUNTER — Other Ambulatory Visit (HOSPITAL_COMMUNITY)
Admission: RE | Admit: 2018-02-10 | Discharge: 2018-02-10 | Disposition: A | Discharge status: Home / Self Care | Payer: Medicaid Other | Source: Ambulatory Visit | Attending: Family Medicine | Admitting: Family Medicine

## 2018-02-10 ENCOUNTER — Ambulatory Visit (INDEPENDENT_AMBULATORY_CARE_PROVIDER_SITE_OTHER): Payer: Self-pay | Admitting: Internal Medicine

## 2018-02-10 VITALS — BP 120/80 | HR 95 | Temp 98.9°F | Wt 156.0 lb

## 2018-02-10 DIAGNOSIS — Z113 Encounter for screening for infections with a predominantly sexual mode of transmission: Secondary | ICD-10-CM

## 2018-02-10 DIAGNOSIS — Z124 Encounter for screening for malignant neoplasm of cervix: Secondary | ICD-10-CM

## 2018-02-10 DIAGNOSIS — L0591 Pilonidal cyst without abscess: Secondary | ICD-10-CM

## 2018-02-10 LAB — POCT WET PREP (WET MOUNT)
CLUE CELLS WET PREP WHIFF POC: NEGATIVE
TRICHOMONAS WET PREP HPF POC: ABSENT

## 2018-02-10 NOTE — Progress Notes (Signed)
[facility] Progress Note  Subjective:  Emily Hodges is a 22 y.o. female who presents for STD check and concern about "tailbone" cyst.   #STD check: - Denies specific concerns. No vaginal discharge. No dyspareunia. No dysuria. - Sexually active with 1 female partner - Has nexplanon (placed 10/2016) and has minimal/no period with this - Also due for pap smear  #Pilonidal cyst: - Has been drained twice previously, last 08/24/17 in ED. Surgical referral was recommended at that point (has not seen). - Reports increased tenderness over site of previous I&D over the last couple of weeks; can tolerate sitting/lying down ROS: No fevers, chills, n/v/d   No Known Allergies  Social History   Tobacco Use  . Smoking status: Never Smoker  . Smokeless tobacco: Never Used  Substance Use Topics  . Alcohol use: Not on file    Objective: Blood pressure 120/80, pulse 95, temperature 98.9 F (37.2 C), temperature source Oral, weight 156 lb (70.8 kg), SpO2 99 %. Body mass index is 31.51 kg/m. Constitutional: Pleasant, obese female in NAD HENT: MMM GU: Chaperone present. No lesions noted on speculum exam. No cervical motion or adnexal tenderness on bimanual exam.  Skin: Swelling of about 3x2 cm just above gluteal cleft with minimal erythema and without obvious fluctuance but mildly TTP.  Psychiatric: Normal mood and affect.  Vitals reviewed  Assessment/Plan: Screening examination for STD (sexually transmitted disease) - Ordered wet prep, gc/chlamydia, HIV and RPR  Cervical cancer screening - Performed pap smear  Pilonidal cyst - Recurrent and starting to have pain flare again. Referred to general surgery due to recurrence and no obvious superficial fluctuance.   HM: Recommended getting TDAP shot at health department.  Follow-up prn  Dani GobbleHillary Ximenna Fonseca, MD [facility], PGY-3

## 2018-02-10 NOTE — Patient Instructions (Signed)
Ms. Sandy SalaamDonahue,  I will call with results.   You should hear from surgery about appointment for pilonidal cyst within the next week.  TDAP vaccine would be covered for free at the health department.  Best, Dr. Sampson GoonFitzgerald

## 2018-02-11 LAB — RPR: RPR Ser Ql: NONREACTIVE

## 2018-02-11 LAB — HIV ANTIBODY (ROUTINE TESTING W REFLEX): HIV Screen 4th Generation wRfx: NONREACTIVE

## 2018-02-12 ENCOUNTER — Encounter: Payer: Self-pay | Admitting: Internal Medicine

## 2018-02-12 DIAGNOSIS — Z113 Encounter for screening for infections with a predominantly sexual mode of transmission: Secondary | ICD-10-CM | POA: Insufficient documentation

## 2018-02-12 DIAGNOSIS — Z124 Encounter for screening for malignant neoplasm of cervix: Secondary | ICD-10-CM | POA: Insufficient documentation

## 2018-02-12 NOTE — Assessment & Plan Note (Signed)
-   Ordered wet prep, gc/chlamydia, HIV and RPR

## 2018-02-12 NOTE — Assessment & Plan Note (Signed)
-   Performed pap smear

## 2018-02-12 NOTE — Assessment & Plan Note (Signed)
-   Recurrent and starting to have pain flare again. Referred to general surgery due to recurrence and no obvious superficial fluctuance.

## 2018-02-13 LAB — CERVICOVAGINAL ANCILLARY ONLY
Chlamydia: NEGATIVE
Neisseria Gonorrhea: NEGATIVE

## 2018-02-13 LAB — CYTOLOGY - PAP

## 2018-02-14 ENCOUNTER — Encounter: Payer: Self-pay | Admitting: Internal Medicine

## 2019-11-03 NOTE — Progress Notes (Signed)
    SUBJECTIVE:   CHIEF COMPLAINT / HPI:   Nexplanon Nexplanon inserted on 11/13/16 by Dr. Sampson Goon.  Patient reports she has had a Nexplanon since she was around 51 or 24 years old.  Prior to that she was using the Depo shot at age 69 and above.  She reports doing well with the Nexplanon would like to continue this.  Has had removal and reinsertion process in the past and has no questions.  Does know the other forms of birth control.  Patient would like to schedule a visit for the Nexplanon removal and reinsertion.  PERTINENT  PMH / PSH: high risk sexual behavior, implanon in place, ADHD, migraine  OBJECTIVE:   BP 115/70   Pulse 94   Wt 151 lb (68.5 kg)   SpO2 97%   BMI 30.50 kg/m   Gen: awake and alert, NAD  ASSESSMENT/PLAN:   Contraception management Patient with desire for Nexplanon removal and reinsertion.  I discussed the process with her and she would like to continue with this.  Appointment made in 2 weeks for removal and reinsertion.  Patient advised that she would likely need a pregnancy test at that time.  Advised to call or send me a MyChart message with any questions.     Emily Manis, DO  PGY-3 [facility]

## 2019-11-04 ENCOUNTER — Other Ambulatory Visit: Payer: Self-pay

## 2019-11-04 ENCOUNTER — Encounter: Payer: Self-pay | Admitting: Family Medicine

## 2019-11-04 ENCOUNTER — Ambulatory Visit (INDEPENDENT_AMBULATORY_CARE_PROVIDER_SITE_OTHER): Payer: Medicaid Other | Admitting: Family Medicine

## 2019-11-04 DIAGNOSIS — Z3046 Encounter for surveillance of implantable subdermal contraceptive: Secondary | ICD-10-CM | POA: Diagnosis present

## 2019-11-04 NOTE — Patient Instructions (Signed)
Etonogestrel implant What is this medicine? ETONOGESTREL (et oh noe JES trel) is a contraceptive (birth control) device. It is used to prevent pregnancy. It can be used for up to 3 years. This medicine may be used for other purposes; ask your health care provider or pharmacist if you have questions. COMMON BRAND NAME(S): Implanon, Nexplanon What should I tell my health care provider before I take this medicine? They need to know if you have any of these conditions:  abnormal vaginal bleeding  blood vessel disease or blood clots  breast, cervical, endometrial, ovarian, liver, or uterine cancer  diabetes  gallbladder disease  heart disease or recent heart attack  high blood pressure  high cholesterol or triglycerides  kidney disease  liver disease  migraine headaches  seizures  stroke  tobacco smoker  an unusual or allergic reaction to etonogestrel, anesthetics or antiseptics, other medicines, foods, dyes, or preservatives  pregnant or trying to get pregnant  breast-feeding How should I use this medicine? This device is inserted just under the skin on the inner side of your upper arm by a health care professional. Talk to your pediatrician regarding the use of this medicine in children. Special care may be needed. Overdosage: If you think you have taken too much of this medicine contact a poison control center or emergency room at once. NOTE: This medicine is only for you. Do not share this medicine with others. What if I miss a dose? This does not apply. What may interact with this medicine? Do not take this medicine with any of the following medications:  amprenavir  fosamprenavir This medicine may also interact with the following medications:  acitretin  aprepitant  armodafinil  bexarotene  bosentan  carbamazepine  certain medicines for fungal infections like fluconazole, ketoconazole, itraconazole and voriconazole  certain medicines to treat  hepatitis, HIV or AIDS  cyclosporine  felbamate  griseofulvin  lamotrigine  modafinil  oxcarbazepine  phenobarbital  phenytoin  primidone  rifabutin  rifampin  rifapentine  St. John's wort  topiramate This list may not describe all possible interactions. Give your health care provider a list of all the medicines, herbs, non-prescription drugs, or dietary supplements you use. Also tell them if you smoke, drink alcohol, or use illegal drugs. Some items may interact with your medicine. What should I watch for while using this medicine? This product does not protect you against HIV infection (AIDS) or other sexually transmitted diseases. You should be able to feel the implant by pressing your fingertips over the skin where it was inserted. Contact your doctor if you cannot feel the implant, and use a non-hormonal birth control method (such as condoms) until your doctor confirms that the implant is in place. Contact your doctor if you think that the implant may have broken or become bent while in your arm. You will receive a user card from your health care provider after the implant is inserted. The card is a record of the location of the implant in your upper arm and when it should be removed. Keep this card with your health records. What side effects may I notice from receiving this medicine? Side effects that you should report to your doctor or health care professional as soon as possible:  allergic reactions like skin rash, itching or hives, swelling of the face, lips, or tongue  breast lumps, breast tissue changes, or discharge  breathing problems  changes in emotions or moods  coughing up blood  if you feel that the implant   may have broken or bent while in your arm  high blood pressure  pain, irritation, swelling, or bruising at the insertion site  scar at site of insertion  signs of infection at the insertion site such as fever, and skin redness, pain or  discharge  signs and symptoms of a blood clot such as breathing problems; changes in vision; chest pain; severe, sudden headache; pain, swelling, warmth in the leg; trouble speaking; sudden numbness or weakness of the face, arm or leg  signs and symptoms of liver injury like dark yellow or brown urine; general ill feeling or flu-like symptoms; light-colored stools; loss of appetite; nausea; right upper belly pain; unusually weak or tired; yellowing of the eyes or skin  unusual vaginal bleeding, discharge Side effects that usually do not require medical attention (report to your doctor or health care professional if they continue or are bothersome):  acne  breast pain or tenderness  headache  irregular menstrual bleeding  nausea This list may not describe all possible side effects. Call your doctor for medical advice about side effects. You may report side effects to FDA at 1-800-FDA-1088. Where should I keep my medicine? This drug is given in a hospital or clinic and will not be stored at home. NOTE: This sheet is a summary. It may not cover all possible information. If you have questions about this medicine, talk to your doctor, pharmacist, or health care provider.  2020 Elsevier/Gold Standard (2019-05-26 11:33:04)  

## 2019-11-05 NOTE — Assessment & Plan Note (Signed)
Patient with desire for Nexplanon removal and reinsertion.  I discussed the process with her and she would like to continue with this.  Appointment made in 2 weeks for removal and reinsertion.  Patient advised that she would likely need a pregnancy test at that time.  Advised to call or send me a MyChart message with any questions.

## 2019-11-05 NOTE — Progress Notes (Signed)
SUBJECTIVE:   CHIEF COMPLAINT / HPI:   H/o LSIL Patient with h/o LSIL in 2019, advised for repeat pap in 1 year. Patient denies unprotected intercourse since September, not interested in STD testing at this time.   GYNECOLOGY CLINIC PROCEDURE NOTE  Yoselyn Chauncey Cruel Keagy is a 24 y.o. No obstetric history on file. here for Nexplanon removal and Nexplanon insertion. No GYN concerns.  Last pap smear was on 02/10/2018 and was abnormal, will repeat today. No other gynecologic concerns.  Nexplanon Removal and Insertion  Patient was given informed consent for removal of her Implanon and insertion of Nexplanon.  Patient does understand that irregular bleeding is a very common side effect of this medication. She was advised to have backup contraception for one week after replacement of the implant. Pregnancy test in clinic today was negative.  Appropriate time out taken. Implanon site identified. Area prepped in usual sterile fashon. 4 cc of 1% lidocaine was used to anesthetize the area at the distal end of the implant as well as throughout the area on insertion. A small stab incision was made right beside the implant on the distal portion. The Nexplanon rod was grasped using hemostats and removed without difficulty. There was minimal blood loss. There were no complications. Area was then injected with 3 ml of 1 % lidocaine. She was re-prepped with betadine, Nexplanon removed from packaging, Device confirmed in needle, then inserted full length of needle and withdrawn per handbook instructions. Nexplanon was able to palpated in the patient's arm; patient palpated the insert herself.  There was minimal blood loss. Patient insertion site covered with guaze and a pressure bandage to reduce any bruising. The patient tolerated the procedure well and was given post procedure instructions.   PERTINENT  PMH / PSH: h/o LSIL   OBJECTIVE:   BP 98/60   Pulse 90   Ht 4\' 11"  (1.499 m)   Wt 152 lb 6.4 oz (69.1 kg)    SpO2 98%   BMI 30.78 kg/m   Gen: awake and alert LUE: Area of previous Nexplanon palpated. Nexplanon was 4cm from sulcus and 10cm from medial epicondyl.  Female genitalia: normal external genitalia, vulva, vagina, cervix, uterus and adnexa   ASSESSMENT/PLAN:   Insertion of Implanon Nexplanon Removal and Insertion  Patient was given informed consent for removal of her Implanon and insertion of Nexplanon.  Patient does understand that irregular bleeding is a very common side effect of this medication. She was advised to have backup contraception for one week after replacement of the implant. Pregnancy test in clinic today was negative.  Appropriate time out taken. Implanon site identified. Area prepped in usual sterile fashon. 4 cc of 1% lidocaine was used to anesthetize the area at the distal end of the implant as well as throughout the area on insertion. A small stab incision was made right beside the implant on the distal portion. The Nexplanon rod was grasped using hemostats and removed without difficulty. There was minimal blood loss. There were no complications. Area was then injected with 3 ml of 1 % lidocaine. She was re-prepped with betadine, Nexplanon removed from packaging, Device confirmed in needle, then inserted full length of needle and withdrawn per handbook instructions. Nexplanon was able to palpated in the patient's arm; patient palpated the insert herself.  There was minimal blood loss. Patient insertion site covered with guaze and a pressure bandage to reduce any bruising. The patient tolerated the procedure well and was given post procedure instructions.  LGSIL on Pap smear of cervix Repeat pap obtained today     Oralia Manis, DO [facility]

## 2019-11-16 ENCOUNTER — Other Ambulatory Visit (HOSPITAL_COMMUNITY)
Admission: RE | Admit: 2019-11-16 | Discharge: 2019-11-16 | Disposition: A | Discharge status: Home / Self Care | Payer: Medicaid Other | Source: Ambulatory Visit | Attending: Family Medicine | Admitting: Family Medicine

## 2019-11-16 ENCOUNTER — Other Ambulatory Visit: Payer: Self-pay

## 2019-11-16 ENCOUNTER — Encounter: Payer: Self-pay | Admitting: Family Medicine

## 2019-11-16 ENCOUNTER — Ambulatory Visit (INDEPENDENT_AMBULATORY_CARE_PROVIDER_SITE_OTHER): Payer: Medicaid Other | Admitting: Family Medicine

## 2019-11-16 VITALS — BP 98/60 | HR 90 | Ht 59.0 in | Wt 152.4 lb

## 2019-11-16 DIAGNOSIS — R87619 Unspecified abnormal cytological findings in specimens from cervix uteri: Secondary | ICD-10-CM | POA: Insufficient documentation

## 2019-11-16 DIAGNOSIS — Z30017 Encounter for initial prescription of implantable subdermal contraceptive: Secondary | ICD-10-CM | POA: Diagnosis not present

## 2019-11-16 DIAGNOSIS — Z3046 Encounter for surveillance of implantable subdermal contraceptive: Secondary | ICD-10-CM | POA: Diagnosis not present

## 2019-11-16 DIAGNOSIS — Z308 Encounter for other contraceptive management: Secondary | ICD-10-CM | POA: Diagnosis not present

## 2019-11-16 DIAGNOSIS — R87612 Low grade squamous intraepithelial lesion on cytologic smear of cervix (LGSIL): Secondary | ICD-10-CM | POA: Diagnosis not present

## 2019-11-16 DIAGNOSIS — Z124 Encounter for screening for malignant neoplasm of cervix: Secondary | ICD-10-CM

## 2019-11-16 LAB — POCT URINE PREGNANCY: Preg Test, Ur: NEGATIVE

## 2019-11-16 MED ORDER — ETONOGESTREL 68 MG ~~LOC~~ IMPL
68.0000 mg | DRUG_IMPLANT | Freq: Once | SUBCUTANEOUS | Status: AC
  Administered 2019-11-16: 12:00:00 68 mg via SUBCUTANEOUS

## 2019-11-16 NOTE — Assessment & Plan Note (Signed)
Nexplanon Removal and Insertion  Patient was given informed consent for removal of her Implanon and insertion of Nexplanon.  Patient does understand that irregular bleeding is a very common side effect of this medication. She was advised to have backup contraception for one week after replacement of the implant. Pregnancy test in clinic today was negative.  Appropriate time out taken. Implanon site identified. Area prepped in usual sterile fashon. 4 cc of 1% lidocaine was used to anesthetize the area at the distal end of the implant as well as throughout the area on insertion. A small stab incision was made right beside the implant on the distal portion. The Nexplanon rod was grasped using hemostats and removed without difficulty. There was minimal blood loss. There were no complications. Area was then injected with 3 ml of 1 % lidocaine. She was re-prepped with betadine, Nexplanon removed from packaging, Device confirmed in needle, then inserted full length of needle and withdrawn per handbook instructions. Nexplanon was able to palpated in the patient's arm; patient palpated the insert herself.  There was minimal blood loss. Patient insertion site covered with guaze and a pressure bandage to reduce any bruising. The patient tolerated the procedure well and was given post procedure instructions.

## 2019-11-16 NOTE — Patient Instructions (Signed)

## 2019-11-16 NOTE — Assessment & Plan Note (Signed)
Repeat pap obtained today  

## 2019-11-20 ENCOUNTER — Encounter: Payer: Self-pay | Admitting: Family Medicine

## 2019-11-20 LAB — CYTOLOGY - PAP
Comment: NEGATIVE
Diagnosis: UNDETERMINED — AB
High risk HPV: NEGATIVE

## 2019-11-26 ENCOUNTER — Other Ambulatory Visit: Payer: Self-pay

## 2019-11-26 ENCOUNTER — Ambulatory Visit (INDEPENDENT_AMBULATORY_CARE_PROVIDER_SITE_OTHER): Payer: Self-pay | Admitting: Family Medicine

## 2019-11-26 DIAGNOSIS — R87618 Other abnormal cytological findings on specimens from cervix uteri: Secondary | ICD-10-CM

## 2019-11-26 NOTE — Progress Notes (Signed)
  LSIL in 2019 and ASCUS in 2021 with HPV hi risk negative 2021.  Patient given informed consent, signed copy in the chart.  Placed in lithotomy position. Cervix viewed with speculum and colposcope after application of acetic acid.   Colposcopy adequate (entire squamocolumnar junctions seen  in entirety) ?  yes Acetowhite lesions? no Punctation? no Mosaicism?  no Abnormal vasculature?  no Biopsies? none ECC? none Complications? none  COMMENTS:  Patient was given post procedure instructions.   ascus pap with clinically negative colposcopy. Recommend pap in one year.

## 2019-11-26 NOTE — Assessment & Plan Note (Signed)
Colposcopy 2021 clinically negative: recommend pap in one year with cotesting and consider subtype with 16/18/45

## 2021-07-11 ENCOUNTER — Other Ambulatory Visit: Payer: Self-pay

## 2021-07-11 ENCOUNTER — Ambulatory Visit
Admission: EM | Admit: 2021-07-11 | Discharge: 2021-07-11 | Disposition: A | Discharge status: Home / Self Care | Payer: Worker's Compensation | Attending: Emergency Medicine | Admitting: Emergency Medicine

## 2021-07-11 DIAGNOSIS — M791 Myalgia, unspecified site: Secondary | ICD-10-CM

## 2021-07-11 MED ORDER — BACLOFEN 10 MG PO TABS: 10.0000 mg | ORAL_TABLET | Freq: Every day | ORAL | 0 refills | Status: AC

## 2021-07-11 MED ORDER — METHYLPREDNISOLONE 4 MG PO TBPK: ORAL_TABLET | ORAL | 0 refills | Status: DC

## 2021-07-11 MED ORDER — KETOROLAC TROMETHAMINE 60 MG/2ML IM SOLN
60.0000 mg | Freq: Once | INTRAMUSCULAR | Status: AC
  Administered 2021-07-11: 60 mg via INTRAMUSCULAR
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
## 2021-07-11 NOTE — Discharge Instructions (Addendum)
You were provided with an injection of ketorolac in the office today for immediate relief of your pain.  Tomorrow morning, please begin Medrol Dosepak, please take 1 row of tablets daily.    I have also provided you with a prescription for a muscle relaxer called baclofen, please take 1 tablet an hour or 2 before you plan to go to bed.    In a few days, if you feel that you would benefit from a second ketorolac injection, please feel free to come back and have 1.  Ketorolac is not habit-forming, nonsedating and nonnarcotic.    I have provided you with a few versions of return to work notes depending on when you are feeling better.  Please discard the others when you choose the one that works best for you.

## 2022-01-30 ENCOUNTER — Encounter: Payer: Self-pay | Admitting: *Deleted

## 2022-03-27 ENCOUNTER — Encounter: Payer: Self-pay | Admitting: Student

## 2022-03-27 ENCOUNTER — Ambulatory Visit (INDEPENDENT_AMBULATORY_CARE_PROVIDER_SITE_OTHER): Payer: Self-pay | Admitting: Student

## 2022-03-27 VITALS — BP 110/72 | HR 100 | Ht 59.0 in | Wt 155.0 lb

## 2022-03-27 DIAGNOSIS — N611 Abscess of the breast and nipple: Secondary | ICD-10-CM

## 2022-03-27 MED ORDER — DOXYCYCLINE HYCLATE 100 MG PO TABS: 100.0000 mg | ORAL_TABLET | Freq: Two times a day (BID) | ORAL | 0 refills | Status: AC

## 2022-03-27 NOTE — Patient Instructions (Signed)
Emily Hodges, So nice to meet you--we did an incision and drainage of the abscess on your L breast. This should heal without issue, you may have some continued drainage of blood and fluid for the next few hours. Keep the area covered with a bandage. If you develop fever/chills, increased pain, red streaks, or increased swelling at the area, please come back to see [place]. You should take the antibiotic I am sending you twice daily for the next 7 days. Dorothyann Gibbs, MD

## 2022-03-27 NOTE — Progress Notes (Signed)
    SUBJECTIVE:   CHIEF COMPLAINT / HPI:   L Breast Abscess Patient presents with pain and swelling on the inferior aspect of her L breast x5 days. She had a similar spot in February that was self-resolving but comes in today due to intolerable and increasing pain. No systemic fevers, though the immediate area has been quite warm to the touch. No discharge from the area and no nipple changes/discharge. No history of abscesses elsewhere.    OBJECTIVE:   BP 110/72   Pulse 100   Ht 4\' 11"  (1.499 m)   Wt 155 lb (70.3 kg)   SpO2 100%   BMI 31.31 kg/m   Physical Exam Chest:       Comments: ~2cm area of fluctuance with ~6cm area of surrounding induration. Entire area is erythematous and quite painful to palpation     ASSESSMENT/PLAN:   Left breast abscess Patient presents with signs and symptoms of a left breast abscess. After shared decision making with patient, decided on in-office I&D followed by course of antibiotics. After signed informed consent was obtained, the area was prepped in the usual fashion using alcohol swabs and iodine. The area was numbed with 90mL of 1% lidocaine and a small incision was made using a #11 scalpel yielding purulent/bloody drainage. A sample of pus was swabbed for culture. Hemostasis was achieved without and the incision was left open with a bandage in place. Return precautions discussed with the patient for recollection of fluid, persistent bleeding, or development of new fever/streaking.  - Doxycycline x7 days     1m, MD [facility]

## 2022-03-28 DIAGNOSIS — N611 Abscess of the breast and nipple: Secondary | ICD-10-CM | POA: Insufficient documentation

## 2022-03-28 NOTE — Assessment & Plan Note (Signed)
Patient presents with signs and symptoms of a left breast abscess. After shared decision making with patient, decided on in-office I&D followed by course of antibiotics. After signed informed consent was obtained, the area was prepped in the usual fashion using alcohol swabs and iodine. The area was numbed with 10mL of 1% lidocaine and a small incision was made using a #11 scalpel yielding purulent/bloody drainage. A sample of pus was swabbed for culture. Hemostasis was achieved without and the incision was left open with a bandage in place. Return precautions discussed with the patient for recollection of fluid, persistent bleeding, or development of new fever/streaking.  - Doxycycline x7 days

## 2022-03-29 LAB — WOUND CULTURE

## 2022-06-06 ENCOUNTER — Ambulatory Visit (INDEPENDENT_AMBULATORY_CARE_PROVIDER_SITE_OTHER): Payer: Self-pay | Admitting: Family Medicine

## 2022-06-06 VITALS — BP 148/87 | HR 73 | Temp 99.3°F | Wt 155.6 lb

## 2022-06-06 DIAGNOSIS — J399 Disease of upper respiratory tract, unspecified: Secondary | ICD-10-CM

## 2022-06-06 DIAGNOSIS — J040 Acute laryngitis: Secondary | ICD-10-CM

## 2022-06-06 DIAGNOSIS — R5383 Other fatigue: Secondary | ICD-10-CM

## 2022-06-06 NOTE — Patient Instructions (Signed)
Laryngitis  Laryngitis is irritation and swelling (inflammation) of your vocal cords. It causes your voice to sound hoarse and may cause you to lose your voice. Depending on the cause, this condition may go away after a short time or may last for more than 3 weeks. Treatment often involves resting your voice and using medicines to soothe your throat. What are the causes? Laryngitis that lasts for a short time may be caused by: An infection caused by a virus. Lots of talking, yelling, or singing. This is also called vocal strain. An infection caused by bacteria. Laryngitis that lasts for more than 3 weeks can be caused by: Lots of talking, yelling, or singing. An injury to the vocal cords. Acid reflux. Allergies. Sinus infection. Mucus draining from the nose down the throat (postnasal drip). Smoking. Drinking too much alcohol. Breathing in chemicals or dust. Having growths on the vocal cords. What increases the risk? Smoking. Drinking too much alcohol. Having allergies. Breathing in fumes at work. What are the signs or symptoms? A change in your voice. It may sound low and hoarse. Loss of voice. Dry cough. Sore throat. Dry throat. Stuffy nose. How is this treated? Treatment depends on what is causing the laryngitis. Usually, treatment includes: Resting your voice. Using medicines to soothe your throat. If your laryngitis is caused by an infection from bacteria, you may need to take antibiotics. If your laryngitis is caused by a growth on your vocal cords, you may need to have a surgery to remove it. Follow these instructions at home: Medicines Take over-the-counter and prescription medicines only as told by your doctor. If you were prescribed an antibiotic medicine, take it as told by your doctor. Do not stop taking it even if you start to feel better. Use throat lozenges or sprays to soothe your throat as told by your doctor. General instructions  Talk as little as  possible. To do this: Avoid whispering. Write instead of talking. Do this until your voice is back to normal. Rinse your mouth (gargle) with a salt water mixture 3-4 times a day or as needed. To make salt water, dissolve -1 tsp (3-6 g) of salt in 1 cup (237 mL) of warm water. Do not swallow this mixture. Drink enough fluid to keep your pee (urine) pale yellow. Breathe in moist air. Use a humidifier if you live in a dry climate. Do not smoke or use any products that contain nicotine or tobacco. If you need help quitting, ask your doctor. Contact a doctor if: You have a fever. Your pain is worse. Your symptoms do not get better in 2 weeks. Get help right away if: You cough up blood. You have trouble swallowing. You have trouble breathing. Summary Laryngitis is inflammation of your vocal cords. This condition causes your voice to sound low and hoarse. Rest your voice by talking as little as possible. Also avoid whispering. Get help right away if you have trouble swallowing or breathing or if you cough up blood. This information is not intended to replace advice given to you by your health care provider. Make sure you discuss any questions you have with your health care provider. Document Revised: 10/31/2020 Document Reviewed: 10/31/2020 Elsevier Patient Education  2023 [employer] Inc[employer]  

## 2022-06-06 NOTE — Progress Notes (Signed)
    SUBJECTIVE:   CHIEF COMPLAINT / HPI:   HPI Sick: The patient has been sick for one month. She started with a cough about four weeks ago. However, since then, she has felt tired and has a poor appetite.  She denies fever at home. Yesterday, she had a headache and felt nauseous, but no vomiting. 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 She uses Nyquil as needed with minimal improvement. She lost her voice for the last 2 weeks. She denies having a sore throat.  PERTINENT  PMH / PSH: PMHx  OBJECTIVE:   BP (!) 148/87   Pulse 73   Temp 99.3 F (37.4 C) (Oral)   Wt 155 lb 9.6 oz (70.6 kg)   SpO2 100%   BMI 31.43 kg/m   Physical Exam Vitals and nursing note reviewed.  Constitutional:      General: She is not in acute distress.    Appearance: Normal appearance. She is not toxic-appearing or diaphoretic.  HENT:     Head:     Comments: Ear canal mildly erythematous B/L with no discharge.    Right Ear: Tympanic membrane normal.     Left Ear: Tympanic membrane normal.     Mouth/Throat:     Mouth: Mucous membranes are moist.     Pharynx: Oropharynx is clear. No oropharyngeal exudate or posterior oropharyngeal erythema.  Eyes:     Extraocular Movements: Extraocular movements intact.     Conjunctiva/sclera: Conjunctivae normal.     Pupils: Pupils are equal, round, and reactive to light.  Cardiovascular:     Rate and Rhythm: Normal rate and regular rhythm.     Pulses: Normal pulses.     Heart sounds: Normal heart sounds. No murmur heard. Pulmonary:     Effort: Pulmonary effort is normal. No respiratory distress.     Breath sounds: Normal breath sounds. No wheezing.      ASSESSMENT/PLAN:  Laryngitis: Likely viral illness. Ear canal mildly inflamed with normal TM COVID-19 and influenza testing done today. May return to work if test negative. In the mean time, I recommended rest and hydration. F/U as needed.  Andrena Mews, MD [place]

## 2022-06-09 LAB — COVID-19, FLU A+B NAA
Influenza A, NAA: NOT DETECTED
Influenza B, NAA: NOT DETECTED
SARS-CoV-2, NAA: NOT DETECTED

## 2022-06-22 ENCOUNTER — Ambulatory Visit (INDEPENDENT_AMBULATORY_CARE_PROVIDER_SITE_OTHER): Payer: Self-pay | Admitting: Student

## 2022-06-22 DIAGNOSIS — J189 Pneumonia, unspecified organism: Secondary | ICD-10-CM | POA: Insufficient documentation

## 2022-06-22 MED ORDER — AZITHROMYCIN 250 MG PO TABS: ORAL_TABLET | ORAL | 0 refills | Status: DC

## 2022-06-22 MED ORDER — AMOXICILLIN 500 MG PO TABS: 1000.0000 mg | ORAL_TABLET | Freq: Every day | ORAL | 0 refills | Status: AC

## 2022-06-22 MED ORDER — PREDNISONE 20 MG PO TABS: 20.0000 mg | ORAL_TABLET | Freq: Every day | ORAL | 0 refills | Status: AC

## 2022-06-22 NOTE — Progress Notes (Signed)
    SUBJECTIVE:   CHIEF COMPLAINT / HPI:   Raul is a 26 year-old female here for sore throat, cough that has been ongoing for 2.5 weeks.  She was initially seen here on 10/11 for viral laryngitis. She is fatigued with cough and hoarseness. She has muscular pain over her anterior chest- using a heating pain which is helping. Just feels like she can't get better. Symptoms have now ongoing for >2 weeks. She is using Nyquil and Robitussin.  Seen here on 10/11 for viral laryngitis.  PERTINENT  PMH / PSH: Reviewed  OBJECTIVE:   BP 138/86   Pulse 91   Ht 4\' 11"  (1.499 m)   Wt 158 lb (71.7 kg)   SpO2 97%   BMI 31.91 kg/m   General: Tired-appearing, but nontoxic appearing CV: RRR Respiratory: Normal work of breathing on room air. No wheezing or crackles. Voice is hoarse Ext: Warm, well-perfused   ASSESSMENT/PLAN:   CAP (community acquired pneumonia) Cough, hoarseness now present for about 2 and half weeks.  Initially thought to be viral laryngitis, but given persistence of symptoms with supportive measures, will treat as CAP.  She is tired appearing, normal work of breathing on room air.  No major red flag signs or symptoms requiring evaluation in the ED.  Also consider bronchitis, allergies, GERD. Amoxicillin 1 g daily x5 days, Z-Pak, and prednisone 20 mg x 5 days sent to pharmacy. Return precautions discussed. If patient is not improving in 5 days, would like to see her back in clinic.     Orvis Brill, [place]

## 2022-06-22 NOTE — Assessment & Plan Note (Signed)
Cough, hoarseness now present for about 2 and half weeks.  Initially thought to be viral laryngitis, but given persistence of symptoms with supportive measures, will treat as CAP.  She is tired appearing, normal work of breathing on room air.  No major red flag signs or symptoms requiring evaluation in the ED.  Also consider bronchitis, allergies, GERD. Amoxicillin 1 g daily x5 days, Z-Pak, and prednisone 20 mg x 5 days sent to pharmacy. Return precautions discussed. If patient is not improving in 5 days, would like to see her back in clinic.

## 2022-06-22 NOTE — Patient Instructions (Signed)
Please take the medicines I sent to the pharmacy (2 antibiotics and Prednisone (steroid)) for 5 days.  Rest, drink plenty of fluids. Use honey for your cough mixed in warm water or tea.  If you are not improving by then, please let us know.  Dr. Owens Shark

## 2022-09-20 ENCOUNTER — Ambulatory Visit (INDEPENDENT_AMBULATORY_CARE_PROVIDER_SITE_OTHER): Payer: Medicaid Other | Admitting: Family Medicine

## 2022-09-20 ENCOUNTER — Encounter: Payer: Self-pay | Admitting: Family Medicine

## 2022-09-20 VITALS — BP 134/91 | HR 88 | Ht 59.0 in | Wt 165.4 lb

## 2022-09-20 DIAGNOSIS — J019 Acute sinusitis, unspecified: Secondary | ICD-10-CM | POA: Diagnosis present

## 2022-09-20 DIAGNOSIS — J329 Chronic sinusitis, unspecified: Secondary | ICD-10-CM | POA: Insufficient documentation

## 2022-09-20 MED ORDER — AMOXICILLIN 875 MG PO TABS: 875.0000 mg | ORAL_TABLET | Freq: Two times a day (BID) | ORAL | 0 refills | Status: AC

## 2022-09-20 MED ORDER — FLUTICASONE PROPIONATE 50 MCG/ACT NA SUSP: 2.0000 | Freq: Every day | NASAL | 0 refills | Status: DC

## 2022-09-20 MED ORDER — GUAIFENESIN 200 MG PO TABS: 200.0000 mg | ORAL_TABLET | ORAL | 0 refills | Status: DC | PRN

## 2022-09-20 NOTE — Assessment & Plan Note (Addendum)
Worsening green thick mucus and productive cough x 2.5 weeks. Normal lung sounds. Likely URI that developed into bacterial sinusitis. Treat with Amoxicillin 875mg  BID x 5 days, Flonase 2 sprays in each nostril BID and Guaifenesin prn. Follow up as needed if symptoms not improving.

## 2022-09-20 NOTE — Progress Notes (Signed)
    SUBJECTIVE:   CHIEF COMPLAINT / HPI:   Sinusitis 2.5 weeks of green, thick mucus drainage and productive cough that has been getting worse. Associated with fatigue, headache, and feeling of pressure in the face. Occasional SOB but not associated with wheezing. 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 Denies fever. Taking Dayquil, Nyquil and Robitussin without relief.  PERTINENT  PMH / PSH: H/o CAP  OBJECTIVE:   BP (!) 134/91   Pulse 88   Ht 4\' 11"  (1.499 m)   Wt 165 lb 6.4 oz (75 kg)   SpO2 99%   BMI 33.41 kg/m    General: NAD, pleasant, able to participate in exam HEENT: TM pearly bilaterally. Mild erythema in L ear canal. No pharyngeal erythema notes. Rhinorrhea noted Cardiac: RRR, no murmurs. Respiratory: CTAB, normal effort, No wheezes, rales or rhonchi  ASSESSMENT/PLAN:   Sinusitis Worsening green thick mucus and productive cough x 2.5 weeks. Normal lung sounds. Likely URI that developed into bacterial sinusitis. Treat with Amoxicillin 875mg  BID x 5 days, Flonase 2 sprays in each nostril BID and Guaifenesin prn. Follow up as needed if symptoms not improving.    Dr. Colletta Maryland, [place]

## 2022-09-20 NOTE — Patient Instructions (Signed)
It was wonderful to see you today! Thank you for choosing [facility].   Please bring ALL of your medications with you to every visit.   Today we talked about:  I am sending in a antibiotic to take for your sinusitis. Please take it twice per day for 5 days. I am also sending in a medication to help thin the secretions that you can take as needed.  Please follow up as needed for worsening symptoms. Please consider making appointment with PCP to discuss healthcare maintenance.  Call the clinic at [phone] if your symptoms worsen or you have any concerns.  Please be sure to schedule follow up at the front desk before you leave today.   Colletta Maryland, DO Family Medicine

## 2022-09-21 ENCOUNTER — Telehealth: Payer: Self-pay

## 2022-09-21 NOTE — Telephone Encounter (Addendum)
Patient calls nurse line to follow up on visit from yesterday, 09/20/22. She states that she was offered an inhaler and declined yesterday. However, after working today, she would like to have inhaler sent to pharmacy for PRN use.   If appropriate, please send to pharmacy.   Talbot Grumbling, RN

## 2022-09-21 NOTE — Addendum Note (Signed)
Addended by: Colletta Maryland on: 09/21/2022 05:30 PM   Modules accepted: Orders

## 2022-09-24 NOTE — Telephone Encounter (Signed)
Called patient. She did not answer, LVM asking patient to call back to discuss further.   Talbot Grumbling, RN

## 2022-10-17 ENCOUNTER — Other Ambulatory Visit: Payer: Self-pay | Admitting: Family Medicine

## 2022-10-17 DIAGNOSIS — J019 Acute sinusitis, unspecified: Secondary | ICD-10-CM

## 2022-10-22 ENCOUNTER — Ambulatory Visit: Payer: Medicaid Other | Admitting: Family Medicine

## 2022-10-22 ENCOUNTER — Other Ambulatory Visit: Payer: Self-pay

## 2022-10-22 VITALS — BP 148/98 | HR 78 | Ht 59.0 in | Wt 164.4 lb

## 2022-10-22 DIAGNOSIS — M546 Pain in thoracic spine: Secondary | ICD-10-CM

## 2022-10-22 DIAGNOSIS — G8929 Other chronic pain: Secondary | ICD-10-CM | POA: Diagnosis not present

## 2022-10-22 MED ORDER — BACLOFEN 10 MG PO TABS: 10.0000 mg | ORAL_TABLET | Freq: Three times a day (TID) | ORAL | 0 refills | Status: DC | PRN

## 2022-10-22 NOTE — Patient Instructions (Addendum)
It was wonderful to see you today.  Please bring ALL of your medications with you to every visit.   Today we talked about:  I am sending a muscle relaxer to use as needed when your pain is significant. Continue to use your heating pad and tinge unit as needed.   I do not think that we need to get imaging at this point.  Continue stretching! We discussed proper lifting techniques today.  Very worrisome symptoms include bowel or bladder incontinence, or numbness in your private area. You should go to the Emergency Department if you have any of this.  Thank you for coming to your visit as scheduled. We have had a large "no-show" problem lately, and this significantly limits our ability to see and care for patients. As a friendly reminder- if you cannot make your appointment please call to cancel. We do have a no show policy for those who do not cancel within 24 hours. Our policy is that if you miss or fail to cancel an appointment within 24 hours, 3 times in a 35-monthperiod, you may be dismissed from our clinic.   Thank you for choosing [facility]   Please call [phone] any questions about today's appointment.  Please be sure to schedule follow up at the front  desk before you leave today.   ASharion Settler DO PGY-3 Family Medicine

## 2022-10-22 NOTE — Progress Notes (Signed)
    SUBJECTIVE:   CHIEF COMPLAINT / HPI:   Emily Hodges is a 27 y.o. female who presents to the [facility] clinic today to discuss the following concerns:   Back Pain: Patient presents for presents evaluation of upper back problems that have been intermittent since 2014. States that it feels like someone is taking a hammer and going into her back. Her pain comes and goes, at most she can go a few days without pain. She reports no pain since Friday. She cannot think of a triggering event. It can come on at rest. She denies any injuries or trauma to her back. She is wondering if possibly her pain is related to her breasts, she is a double D. Any kind of movement will make her pain worse when she has it- will make it radiate to her chest. She is unable to lay down when she has pain. She has been using a heating pad and tinge unit which help a bit. Previous lower back problems:  intermittent but she reports she is not concerned about her lower back since she can deal with the discomfort and since sitting makes her pain go away. The pain does not radiate . Previous workup: none. Previous treatments:  cyclobenzaprine, naproxen . No saddle anesthesia, bladder or bowel incontinence, no IVDU, fevers. She has never done formal PT but has tried home exercises.    PERTINENT  PMH / PSH: N/A  OBJECTIVE:   BP (!) 144/98   Pulse 78   Ht 4' 11"$  (1.499 m)   Wt 164 lb 6.4 oz (74.6 kg)   SpO2 100%   BMI 33.20 kg/m   Vitals:   10/22/22 1454 10/22/22 1519  BP: (!) 144/98 (!) 148/98  Pulse: 78   SpO2: 100%    General: NAD, pleasant, able to participate in exam Respiratory: normal effort Back Exam:  Inspection: Unremarkable, no deformities or rash present.  Palpable tenderness: None. Range of Motion:  Flexion 45 deg; Extension 45 deg; Side Bending to 45 deg bilaterally; Rotation to 45 deg bilaterally  Sensory change: Gross sensation intact Gait unremarkable. Skin: warm and dry, no rashes noted Psych:  Normal affect and mood  ASSESSMENT/PLAN:   1. Chronic bilateral thoracic back pain Ongoing for several years without inciting injury/trauma. Spine is midline- doubt scoliosis is playing a role. She does wear size DD bras and we discussed that her breasts may be contributing- her pain seems to be over where her bra strap is. Her ROM is normal and she has normal gait. No pain today. She has had relief from muscle relaxers in the past, will send in some more to take PRN when the pain is severe. No red flags.  - Defer imaging for now  - Continue heating pads, tinge unit as needed  - baclofen (LIORESAL) 10 MG tablet; Take 1 tablet (10 mg total) by mouth 3 (three) times daily as needed for muscle spasms.  Dispense: 30 each; Refill: 0 - Continue back stretches, exercises.     Sharion Settler, [place]

## 2022-10-30 ENCOUNTER — Ambulatory Visit: Payer: Medicaid Other | Admitting: Family Medicine

## 2022-10-30 VITALS — BP 124/82 | HR 91 | Ht 59.0 in | Wt 163.6 lb

## 2022-10-30 DIAGNOSIS — Z309 Encounter for contraceptive management, unspecified: Secondary | ICD-10-CM | POA: Diagnosis present

## 2022-10-30 DIAGNOSIS — Z3046 Encounter for surveillance of implantable subdermal contraceptive: Secondary | ICD-10-CM | POA: Diagnosis present

## 2022-10-30 NOTE — Procedures (Deleted)
    PROCEDURE NOTE: NEXPLANON  REMOVAL and REINSERTION  REMOVAL Patient given informed consent and signed copy in the chart. An appropriate time out was taken. Left arm area prepped and draped in the usual sterile fashion. Three cc of 1% lidocaine without epinephrine used for local anesthesia. A small stab incision was made close to the nexplanon with scalpel. Hemostats were used to withdraw the nexplanon INSERTION new NEXPLANON Prior to the removal procedure, an appropriate time out had been taken and  there were no breaks in patient care between the procedures. Sterile field had been maintained as well as local anesthesia.   Nexplanon was inserted in typical fashion in the LEFT ARM (same site previously used) and where removal was performed. There were no complications.  Pressure bandage was applied to decrease bruising. Patient given follow up instructions should she experience redness, swelling at sight or fever in the next 24 hours. Patient given Nexplanon pocket card.    Alcus Dad, MD [place]

## 2022-10-30 NOTE — Progress Notes (Signed)
    SUBJECTIVE:   CHIEF COMPLAINT / HPI:   Contraception management Here for nexplanon removal and reinsertion  OBJECTIVE:   BP 124/82   Pulse 91   Ht '4\' 11"'$  (1.499 m)   Wt 163 lb 9.6 oz (74.2 kg)   SpO2 98%   BMI 33.04 kg/m   General: NAD, pleasant, able to participate in exam Respiratory: No respiratory distress Skin: warm and dry, no rashes noted Psych: Normal affect and mood Neuro: grossly intact  ASSESSMENT/PLAN:   PROCEDURE NOTE: NEXPLANON  REMOVAL and REINSERTION   REMOVAL Patient given informed consent and signed copy in the chart. An appropriate time out was taken. Left arm area prepped and draped in the usual sterile fashion. Three cc of 1% lidocaine without epinephrine used for local anesthesia. A small stab incision was made close to the nexplanon with scalpel. Hemostats were used to withdraw the nexplanon INSERTION new NEXPLANON Prior to the removal procedure, an appropriate time out had been taken and  there were no breaks in patient care between the procedures. Sterile field had been maintained as well as local anesthesia.   Nexplanon was inserted in typical fashion in the LEFT ARM (same site previously used) and where removal was performed. There were no complications.  Pressure bandage was applied to decrease bruising. Patient given follow up instructions should she experience redness, swelling at sight or fever in the next 24 hours. Patient given Nexplanon pocket card.   Alcus Dad, MD [place]

## 2022-10-30 NOTE — Patient Instructions (Signed)
Congratulations on getting your Nexplanon placement!  Below is some important information about Nexplanon.  First remember that Nexplanon does not prevent sexually transmitted infections.  Condoms will help prevent sexually transmitted infections. The Nexplanon starts working 7 days after it was inserted.  There is a risk of getting pregnant if you have unprotected sex in those first 7 days after placement of the Nexplanon.  The Nexplanon lasts for 3 years but can be removed at any time.  You can become pregnant as early as 1 week after removal.  You can have a new Nexplanon put in after the old one is removed if you like.  Always tell other healthcare providers that you have a Nexplanon in your arm.  The Nexplanon was placed just under the skin.  Leave the outside bandage on for 24 hours.  Leave the smaller bandage on for 3-5 days or until it falls off on its own.  Keep the area clean and dry for 3-5 days. There is usually bruising or swelling at the insertion site for a few days to a week after placement.  If you see redness or pus draining from the insertion site, call us immediately.  Keep your user card with the date the implant was placed and the date the implant is to be removed.  The most common side effect is a change in your menstrual bleeding pattern.   This bleeding is generally not harmful to you but can be annoying.  Call or come in to see [place] if you have any concerns about the bleeding or if you have any side effects or questions.

## 2022-11-05 MED ORDER — ETONOGESTREL 68 MG ~~LOC~~ IMPL
68.0000 mg | DRUG_IMPLANT | Freq: Once | SUBCUTANEOUS | Status: AC
  Administered 2022-10-30: 68 mg via SUBCUTANEOUS

## 2022-11-05 NOTE — Addendum Note (Signed)
Addended by: Talbot Grumbling on: 11/05/2022 12:08 PM   Modules accepted: Orders

## 2022-11-19 ENCOUNTER — Encounter: Payer: Self-pay | Admitting: Pharmacist

## 2022-11-19 ENCOUNTER — Ambulatory Visit: Payer: Medicaid Other | Admitting: Pharmacist

## 2022-11-19 VITALS — BP 120/81 | HR 80 | Wt 164.4 lb

## 2022-11-19 DIAGNOSIS — R030 Elevated blood-pressure reading, without diagnosis of hypertension: Secondary | ICD-10-CM | POA: Diagnosis not present

## 2022-11-19 NOTE — Progress Notes (Signed)
   S:     Chief Complaint  Patient presents with   Medication Management    Amb BP Monitor Set-Up   27 y.o. female who presents for hypertension evaluation, education, and management.  PMH is significant for monitoring home blood pressure readings.  Patient was last seen by Primary Care Provider, Dr. Rock Nephew, on 10/30/2022.   No current diagnosis of HTN. 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  Reports number of home readings can be as high at systolic of 99991111.   Medication compliance is reported to be adherent.  Discussed procedure for wearing the monitor and gave patient written instructions. Monitor was placed on non-dominant arm with instructions to return in the morning.   Current BP Medications include:  None  Antihypertensives tried in the past include: None  Dietary habits include: Drinks pomegranate juice everyday. Eliminated fast food from diet. Usually bakes or air-fry foods, stays away from fatty foods.  O:  Review of Systems  All other systems reviewed and are negative.   Physical Exam Vitals reviewed.  Constitutional:      Appearance: Normal appearance.  Pulmonary:     Effort: Pulmonary effort is normal.  Neurological:     Mental Status: She is alert.  Psychiatric:        Mood and Affect: Mood normal.        Behavior: Behavior normal.        Thought Content: Thought content normal.     Last 3 Office BP readings: BP Readings from Last 3 Encounters:  11/19/22 120/81  10/30/22 124/82  10/22/22 (!) 148/98   Clinical Atherosclerotic Cardiovascular Disease (ASCVD): No   Basic Metabolic Panel    Component Value Date/Time   NA 141 10/06/2014 1140   K 4.8 10/06/2014 1140   CL 105 10/06/2014 1140   CO2 24 10/06/2014 1140   GLUCOSE 91 10/06/2014 1140   BUN 6 10/06/2014 1140   CREATININE 0.72 10/06/2014 1140   CALCIUM 10.2 10/06/2014 1140    ABPM Study Data: Arm Placement left arm  For Office  Goal Goal BP of <130/80:  ABPM thresholds: Overall BP < 125/75, daytime BP <130/80 mmHg, sleeptime BP <110/65 mmHg    Patient is participating in a Managed Medicaid Plan:  Yes   A/P: No current HTN diagnosis, history of elevated BP after work and when stressed. Home readings of 99991111 systolic reported.  Multiple elevated readings in office in the past. -Placed ambulatory blood pressure monitor for evaluation of potential new diagnosis of hypertension.  Written patient instructions provided on keeping the cuff, reporting any changes to normal daily activities, and returning the cuff 11/20/22 at 830. Total time in face to face counseling 20 minutes.    Follow-up:  Pharmacist 11/20/2022 at 8:30 AM Patient seen with Estelle June, PharmD Candidate.

## 2022-11-19 NOTE — Patient Instructions (Signed)
Blood Pressure Activity Diary Time Lying down/ Sleeping Walking/ Exercise Stressed/ Angry Headache/ Pain Dizzy  9 AM       10 AM       11 AM       12 PM       1 PM       2 PM       Time Lying down/ Sleeping Walking/ Exercise Stressed/ Angry Headache/ Pain Dizzy  3 PM       4 PM        5 PM       6 PM       7 PM       8 PM       Time Lying down/ Sleeping Walking/ Exercise Stressed/ Angry Headache/ Pain Dizzy  9 PM       10 PM       11 PM       12 AM       1 AM       2 AM       3 AM       Time Lying down/ Sleeping Walking/ Exercise Stressed/ Angry Headache/ Pain Dizzy  4 AM       5 AM       6 AM       7 AM       8 AM       9 AM       10 AM        Time you woke up: _________                  Time you went to sleep:__________  Come back tomorrow at 8:30 to have the monitor removed Call the Family Medicine Clinic if you have any questions before then ([phone])  Wearing the Blood Pressure Monitor The cuff will inflate every 20 minutes during the day and every 30 minutes while you sleep. Your blood pressure readings will NOT display after cuff inflation Fill out the blood pressure-activity diary during the day, especially during activities that may affect your reading -- such as exercise, stress, walking, taking your blood pressure medications  Important things to know: Avoid taking the monitor off for the next 24 hours, unless it causes you discomfort or pain. Do NOT get the monitor wet and do NOT dry to clean the monitor with any cleaning products. Do NOT put the monitor on anyone else's arm. When the cuff inflates, avoid excess movement. Let the cuffed arm hang loosely, slightly away from the body. Avoid flexing the muscles or moving the hand/fingers. When you go to sleep, make sure that the hose is not kinked. Remember to fill out the blood pressure activity diary. If you experience severe pain or unusual pain (not associated with getting your blood pressure  checked), remove the monitor.  Troubleshooting:  Code  Troubleshooting   1  Check cuff position, tighten cuff   2, 3  Remain still during reading   4, 87  Check air hose connections and make sure cuff is tight   85, 89  Check hose connections and make tubing is not crimped   86  Push START/STOP to restart reading   88, 91  Retry by pushing START/STOP   90  Replace batteries. If problem persists, remove monitor and bring back to   clinic at follow up   97, 98, 99  Service required - Remove monitor and bring back to clinic at   follow up    

## 2022-11-19 NOTE — Progress Notes (Signed)
Reviewed and agree with Dr Koval's plan.   

## 2022-11-19 NOTE — Assessment & Plan Note (Signed)
No current HTN diagnosis, history of elevated BP after work and when stressed. Home readings of 99991111 systolic reported.  Multiple elevated readings in office in the past. -Placed ambulatory blood pressure monitor for evaluation of potential new diagnosis of hypertension.  Written patient instructions provided on keeping the cuff, reporting any changes to normal daily activities, and returning the cuff 11/20/22 at 830.

## 2022-11-20 ENCOUNTER — Ambulatory Visit: Payer: Medicaid Other | Admitting: Pharmacist

## 2022-11-20 VITALS — BP 114/71

## 2022-11-20 DIAGNOSIS — R030 Elevated blood-pressure reading, without diagnosis of hypertension: Secondary | ICD-10-CM

## 2022-11-20 NOTE — Assessment & Plan Note (Signed)
No history of diagnosed hypertension, with goal presssure of <130/80 mmHg. Found to have normal blood pressure with 24-hour ambulatory blood pressure evaluation which demonstrates an average AWAKE blood pressure of 114/71 mmHg. Nocturnal dipping pattern is normal.   -No medication changes suggested at this time

## 2022-11-20 NOTE — Progress Notes (Signed)
   S:     Chief Complaint  Patient presents with   Medication Management    Amb BP Monitor Return   27 y.o. female who presents for hypertension evaluation, education, and management.  PMH is significant for elevated blood pressure readings with stress of her work.  Patient was referred and last seen by Primary Care Provider, Dr. Rock Nephew, on 10/30/2022.   Since last visit, patient has been monitoring blood pressure at home.  She notes that she has elevated readings during or shortly after periods of stress.  She notes that after several minutes of rest her blood pressure returns to normal readings< 130/80.    NO current diagnosis of Hypertension.   No current blood pressure lowering agents.     Patient returns to clinic and reports no issues with monitor. Patient reports they were able to wear the Ambulatory Blood Pressure Cuff for the entire 24 evaluation period.   O:  Review of Systems  All other systems reviewed and are negative.   Physical Exam Constitutional:      Appearance: Normal appearance.  Pulmonary:     Effort: Pulmonary effort is normal.  Neurological:     Mental Status: She is alert.  Psychiatric:        Mood and Affect: Mood normal.        Behavior: Behavior normal.        Thought Content: Thought content normal.     Last 3 Office BP readings: BP Readings from Last 3 Encounters:  11/20/22 114/71  11/19/22 120/81  10/30/22 124/82    Clinical Atherosclerotic Cardiovascular Disease (ASCVD): No    Basic Metabolic Panel    Component Value Date/Time   NA 141 10/06/2014 1140   K 4.8 10/06/2014 1140   CL 105 10/06/2014 1140   CO2 24 10/06/2014 1140   GLUCOSE 91 10/06/2014 1140   BUN 6 10/06/2014 1140   CREATININE 0.72 10/06/2014 1140   CALCIUM 10.2 10/06/2014 1140    ABPM Study Data: Arm Placement left arm  Overall Mean 24hr BP:   111/69 mmHg  HR: 85  Daytime Mean BP:  114/71 mmHg  HR: 85  Nighttime Mean BP:  104/61 mmHg  HR: 82  Dipping Pattern:  Yes.    Sys:   8.6%   Dia: 13.9%   [normal dipping ~10-20%]  For Office Goal Goal BP of <130/80:  ABPM thresholds: Overall BP < 125/75, daytime BP <130/80 mmHg, sleeptime BP <110/65 mmHg    Patient is participating in a Managed Medicaid Plan:  Yes   A/P: No history of diagnosed hypertension, with goal presssure of <130/80 mmHg. Found to have normal blood pressure with 24-hour ambulatory blood pressure evaluation which demonstrates an average AWAKE blood pressure of 114/71 mmHg. Nocturnal dipping pattern is normal.   -No medication changes suggested at this time  Results reviewed and written information provided.    Written patient instructions provided. Patient verbalized understanding of treatment plan.  Total time in face to face counseling 25 minutes.    Follow-up:  PCP clinic visit as needed

## 2022-11-20 NOTE — Patient Instructions (Signed)
Ambulatory blood pressure monitor shows excellent control of your blood pressure.   Awake readings were 114/71.  No changes suggested.

## 2022-11-21 NOTE — Progress Notes (Signed)
Reviewed and agree with Dr Koval's plan.   

## 2023-05-29 ENCOUNTER — Encounter: Payer: Self-pay | Admitting: Family Medicine

## 2023-05-29 ENCOUNTER — Ambulatory Visit: Payer: Medicaid Other

## 2023-05-29 VITALS — BP 138/98 | HR 77 | Temp 98.8°F | Ht 59.0 in | Wt 169.5 lb

## 2023-05-29 DIAGNOSIS — R052 Subacute cough: Secondary | ICD-10-CM

## 2023-05-29 DIAGNOSIS — M546 Pain in thoracic spine: Secondary | ICD-10-CM

## 2023-05-29 DIAGNOSIS — J019 Acute sinusitis, unspecified: Secondary | ICD-10-CM

## 2023-05-29 DIAGNOSIS — G8929 Other chronic pain: Secondary | ICD-10-CM | POA: Diagnosis not present

## 2023-05-29 DIAGNOSIS — R051 Acute cough: Secondary | ICD-10-CM | POA: Diagnosis present

## 2023-05-29 LAB — POC SOFIA 2 FLU + SARS ANTIGEN FIA
Influenza A, POC: NEGATIVE
Influenza B, POC: NEGATIVE
SARS Coronavirus 2 Ag: NEGATIVE

## 2023-05-29 MED ORDER — FLUTICASONE PROPIONATE 50 MCG/ACT NA SUSP: 2.0000 | Freq: Every day | NASAL | 1 refills | Status: AC | PRN

## 2023-05-29 MED ORDER — BACLOFEN 10 MG PO TABS: 10.0000 mg | ORAL_TABLET | Freq: Every evening | ORAL | 0 refills | Status: DC | PRN

## 2023-05-29 NOTE — Patient Instructions (Signed)
Thank you for visiting clinic today - it is always our pleasure to care for you.  Today we discussed your recent cough and cold symptoms. We are collecting a flu/covid test for you today and will contact you with the results. In the meantime, please continue to stay hydrated as best you can. For your sternal pain, you may take an over the counter anti-inflammatory like Tylenol to help alleviate your symptoms. We hope you are feeling better soon!   Please schedule an appointment in 1 month for follow up on your blood pressure.   Reach out any time with any questions or concerns you may have - we are here for you!  Ivery Quale, MD [facility] [phone]   Therapy and Counseling Resources Most providers on this list will take Medicaid. Patients with commercial insurance or Medicare should contact their insurance company to get a list of in network providers.  [facility]:Psychiatry and Counseling [address] [place], [place] [address] [phone]  [facility]  [address], [address] [place], [place] [address]      [phone]  [facility] [address]  [place], [place] [address] [phone]  [facility] [address]  [place], [place] [address]       [phone]     [facility] (virtual only) [phone]  [facility]ovita [facility]ussmaul [facility] [address] [place], [place] [phone]  [facility]:  [address] [place] [phone]  [facility]:  [address] [place] [phone]  [facility] (under & uninsured) [address]   [place] [phone]    [email or web address]    [facility] [address]  [place]    [phone]  [facility] -[address]     Phone:  [phone]     [facility]  [address]  [phone]    [facility] [address]      [place], [place] [phone]  [facility]: [address] [place], [place], [place]  [phone]   [facility] (Spanish therapist) [email or web address]  [address] [place]  [address]   [place] [address]    [phone]    [facility]   [address],  [place], [place]  [phone]   [facility]  [address] [place]  [phone]  [facility]  [address] [place], [place]        [phone]  Open Access/Walk In Clinic under & uninsured  [facility]  [address] [place], [place] [phone] Crisis [phone]  [facility] [place],  (Spanish)   [address], [place]: [phone]) 8:30 - 12; 1 - 2:30  [facility] [employer],  [address] [place], [place]    [phone]):8:30 - 12; 2 - 3PM  [facility] [employer],  [address],  [place]; [phone]):   Mon - Fri 8 AM - 5 PM  [facility] [address] [place]  MWF 12:30 to 3:00 or call to schedule an appointment  [phone]  Specific Provider options Psychology Today  [email or web address]/us click on find a therapist  enter your zip code left side and select or tailor a therapist for your specific need.  [facility] Provider Directory [email or web address]  (Medicaid)   Follow all drop down to find a provider  Social Support program [facility] [place] [phone] or PhotoS[employer].pl [address], [place], [place] Recovery support and educational   24- Hour Availability:   [facility]  [address] [place], [place] [phone] Crisis [phone]  [facility] [phone]  [facility]  [phone]   [facility]  [phone] (after hours)  Therapeutic Alternative/Mobile Crisis   [phone]  [place] National Suicide Hotline  [phone] Len Childs)  Call 911 or go to emergency room  [facility]  [phone]);  [place] and [employer]  [phone]); [place], [place], [place], [place], [place], [place], [place]

## 2023-05-29 NOTE — Progress Notes (Unsigned)
    SUBJECTIVE:   CHIEF COMPLAINT / HPI:   Cough and congestion, frontal headache Two weeks of coughing, congestion, and fatigue  Sometimes productive greenish, no blood  Abdominal pain + Diarrhea for 3 days this past weekend, still soft - no blood, no tarry  Eyes have been itchy and watery Difficult to eat, drinking fluids  No new foods  100 temp Dayquil, nyquil, mucinex   Sternal pain, worse with coughing and deep breaths- warmth helps No sharp CP, no radiation   No regular inhalers/breathing treatments  Occasional vape, none since getting sick   Works at [employer] - lots of kids getting sick, covid   PHQ9 :  ADHD - was on medication as a kid , not  Provide therapists   Needs baclofen refill for back spasms ?   PERTINENT  PMH / PSH: ***  OBJECTIVE:   There were no vitals taken for this visit.  ***  ASSESSMENT/PLAN:   No problem-specific Assessment & Plan notes found for this encounter.   Flu/covid   Emily Quale, MD [facility]

## 2023-06-17 ENCOUNTER — Ambulatory Visit
Admission: RE | Admit: 2023-06-17 | Discharge: 2023-06-17 | Disposition: A | Discharge status: Home / Self Care | Payer: Medicaid Other | Source: Ambulatory Visit | Attending: Family Medicine | Admitting: Family Medicine

## 2023-06-17 ENCOUNTER — Ambulatory Visit: Payer: Medicaid Other

## 2023-06-17 VITALS — BP 139/103 | HR 88 | Temp 99.7°F | Ht 59.0 in | Wt 164.8 lb

## 2023-06-17 DIAGNOSIS — R051 Acute cough: Secondary | ICD-10-CM | POA: Diagnosis present

## 2023-06-17 DIAGNOSIS — R030 Elevated blood-pressure reading, without diagnosis of hypertension: Secondary | ICD-10-CM

## 2023-06-17 DIAGNOSIS — G8929 Other chronic pain: Secondary | ICD-10-CM

## 2023-06-17 DIAGNOSIS — M546 Pain in thoracic spine: Secondary | ICD-10-CM | POA: Diagnosis not present

## 2023-06-17 DIAGNOSIS — N62 Hypertrophy of breast: Secondary | ICD-10-CM

## 2023-06-17 LAB — POC SOFIA 2 FLU + SARS ANTIGEN FIA
Influenza A, POC: NEGATIVE
Influenza B, POC: NEGATIVE
SARS Coronavirus 2 Ag: NEGATIVE

## 2023-06-17 MED ORDER — METHOCARBAMOL 750 MG PO TABS: 1500.0000 mg | ORAL_TABLET | Freq: Three times a day (TID) | ORAL | 1 refills | Status: DC | PRN

## 2023-06-17 MED ORDER — METHOCARBAMOL 750 MG PO TABS: 1200.0000 mg | ORAL_TABLET | Freq: Three times a day (TID) | ORAL | 1 refills | Status: DC | PRN

## 2023-06-17 NOTE — Patient Instructions (Addendum)
Make an appointment on your way out to follow up with your PCP for a Pap Smear.   Please go to [facility] [employer] to get your X-Ray done. They are open 7:30a-5p Monday-Friday. You do not need an appointment to get this done.   I am referring you to both plastic surgery and physical therapy for your back pain. Be expecting calls from both of their offices.   If you start having fevers, call us back or send me a MyChart message and I will send in some antibiotics.   Eliezer Mccoy, MD

## 2023-06-17 NOTE — Progress Notes (Unsigned)
    SUBJECTIVE:   CHIEF COMPLAINT / HPI:   Acute on Chronic Thoracic Back Pain Longstanding issue, ongoing since at least 2014. 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 Has tried Baclofen with no response. Has never tried Robaxin or tizanidine. No fevers or infectious symptoms. No injuries to the area. 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  Has put off actually meeting with a plastic surgeon for consideration of breast reduction for a while now. Now willing to pursue this given impact on her quality of life. Notes elevated PHQ-9 today which she attributes to her back pain. Denies any feelings of suicidality, just frustrated by the pain.   Cough Present for about a week now. 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 No other infectious symptoms to speak of--just the cough. On chart review note that she was diagnosed with CAP just about a year ago based on cough and abnormal lung sounds, no CXR done at the time.   OBJECTIVE:   BP (!) 139/103   Pulse 88   Temp 99.7 F (37.6 C)   Ht 4\' 11"  (1.499 m)   Wt 164 lb 12.8 oz (74.8 kg)   SpO2 100%   BMI 33.29 kg/m   ***  ASSESSMENT/PLAN:   No problem-specific Assessment & Plan notes found for this encounter.     Eliezer Mccoy, MD [facility]

## 2023-06-18 DIAGNOSIS — N62 Hypertrophy of breast: Secondary | ICD-10-CM | POA: Insufficient documentation

## 2023-06-18 DIAGNOSIS — G8929 Other chronic pain: Secondary | ICD-10-CM | POA: Insufficient documentation

## 2023-06-18 DIAGNOSIS — R051 Acute cough: Secondary | ICD-10-CM | POA: Insufficient documentation

## 2023-06-18 NOTE — Assessment & Plan Note (Signed)
Elevated x2 today. Has been recently investigated with ambulatory BP monitoring which was nl in March of this year. May also have some transient elevation given acute on chronic back pain.  - No meds for now.

## 2023-06-18 NOTE — Assessment & Plan Note (Signed)
Agree that this chronic issue is most likely related to her macromastia. No red flags on history or exam. Surprised that she has never had physical therapy given chronicity of this issue. She is open to pursuing PT. - Ambulatory referral to PT - Ambulatory referral to Dr. Ulice Bold for breast reduction consult - Will transition her off of baclofen due to lack of benefit--will trial Robaxin instead. 1500mg  q8h PRN.  - No dedicated imaging at this time (though CXR we are getting for #2 would reveal most gross abnormalities) but certainly could consider if symptoms worsen

## 2023-06-18 NOTE — Assessment & Plan Note (Signed)
Present x1 week. Negative COVID/Flu testing today. I have the highest suspicion for viral illness given likelihood of exposures through her work, low suspicion for pneumonia in the absence of systemic symptoms. However, given vaping history, crackles on R, and history of PNA diagnosed one year ago in an otherwise healthy 27 yo, will obtain CXR to evaluate for PNA or evidence of vaping-related lung injury. - Hold off on abx for now, she will reach out if she worsens and I would cover with abx should she develop full-blown fever, difficulty breathing, or increased sputum production

## 2023-06-24 ENCOUNTER — Ambulatory Visit: Payer: Medicaid Other | Admitting: Student

## 2023-06-24 VITALS — BP 137/99 | Ht 59.0 in | Wt 161.2 lb

## 2023-06-24 DIAGNOSIS — M546 Pain in thoracic spine: Secondary | ICD-10-CM

## 2023-06-24 DIAGNOSIS — G8929 Other chronic pain: Secondary | ICD-10-CM | POA: Diagnosis not present

## 2023-06-24 NOTE — Progress Notes (Signed)
    SUBJECTIVE:   CHIEF COMPLAINT / HPI:   Emily Hodges is a 27 y.o. female  presenting for follow-up for continued thoracic back pain.  Patient reports her pain has been present since she was 27 years old.  She has tried multiple over-the-counter medications, NSAIDs, heating pad, stretching, rest without significant relief.  She was most recently seen in the office and prescribed Robaxin which she reports helps her few hours but does not last a full 8 hours.  She was referred to surgeon for breast reduction and is working on setting up an appointment.  She has also been referred to physical therapy and will work on getting in with them as well.  PERTINENT  PMH / PSH: Reviewed and updated   OBJECTIVE:   BP (!) 137/99   Ht 4\' 11"  (1.499 m)   Wt 161 lb 3.2 oz (73.1 kg)   BMI 32.56 kg/m   Well-appearing, no acute distress Cardio: Regular rate, regular rhythm, no murmurs on exam. Pulm: Clear, no wheezing, no crackles. No increased work of breathing Abdominal: bowel sounds present, soft, non-tender, non-distended Extremities: no peripheral edema  Neuro: alert and oriented x3, speech normal in content, no facial asymmetry, strength intact and equal bilaterally in UE and LE, pupils equal and reactive to light.  MSK: Generalized tenderness over thoracic back, no spinal process tenderness on exam.     06/24/2023    1:23 PM 06/17/2023    2:39 PM 05/29/2023   11:40 AM  PHQ9 SCORE ONLY  PHQ-9 Total Score 17 18 11       ASSESSMENT/PLAN:   Chronic bilateral thoracic back pain Shared decision making with patient to try to maximize topical medications.  Educated patient on lidocaine patches, Voltaren gel to be used up to 4 times a day, capsaicin cream.  See AVS for additional administration details.  Discussed importance of physical therapy and seeing surgeon for breast reduction.  Also discussed that if she continues to have back pain after treatment with physical therapy and surgery she  may fit into the chronic pain category which an SNRI may be indicated.     Glendale Chard, DO [facility]

## 2023-06-24 NOTE — Assessment & Plan Note (Signed)
Shared decision making with patient to try to maximize topical medications.  Educated patient on lidocaine patches, Voltaren gel to be used up to 4 times a day, capsaicin cream.  See AVS for additional administration details.  Discussed importance of physical therapy and seeing surgeon for breast reduction.  Also discussed that if she continues to have back pain after treatment with physical therapy and surgery she may fit into the chronic pain category which an SNRI may be indicated.

## 2023-06-24 NOTE — Patient Instructions (Signed)
For your pain you can try several over the counter topical medications. Topical medications are a great option to treat pain as you can place the medication right where you are hurting and they have less side effects.   Voltaren Gel/Diclofenac Gel: this is an anti-inflammatory cream (same ingredient as Motrin/Ibuprofen/Aleve) can be applied up to 4 times per day.     Lidocaine patch: can be applied for 12 hours and after needs a 12 hour break to continue to be effective. Works by using numbing medication to help with pain relief. Can be found at any pharmacy over the counter or on [email or web address]    Capsaicin cream: made from the same ingredient that makes hot peppers spicy, this works by numbing up the area of pain. Please wash your hands and do not touch your eyes after applying the cream as active ingredient can hurt your eyes.

## 2023-06-26 ENCOUNTER — Encounter: Payer: Self-pay | Admitting: Student

## 2023-07-03 NOTE — Patient Instructions (Signed)
It was great to see you! Thank you for allowing me to participate in your care!  I recommend that you always bring your medications to each appointment as this makes it easy to ensure we are on the correct medications and helps [place] not miss when refills are needed.  Our plans for today:  -Gallstone We are going to place a referral to General Surgery *If you haven't heard anything in 2 weeks, call and ask about the referral    Emergency Care  If you develop any of these symptoms, seek Emergency Care: Intense abdominal pain, abdominal fullness, yellowing of your eyes or skin, or fever   Take care and seek immediate care sooner if you develop any concerns.   Dr. Bess Kinds, MD [facility]

## 2023-07-03 NOTE — Progress Notes (Addendum)
  SUBJECTIVE:   CHIEF COMPLAINT / HPI:   Gallstone -UC visit 11/4 Noted gallstone on Abd [place], 4.7 cm within the gallbladder neck  -Patient to f/u w/ Gen Surg  Today: Has had to change her eating, and is taking ibuprffen before eating and it helps. Has stopped fast food and spicy food. This issue has been going on for 2-3 weeks. She is coming in to get connected with Gen Surg resources here in [place], as she was seen in UC in [facility]. Eating is going well and bowel movements are going well. No fever's or systemic symptoms.   PERTINENT  PMH / PSH:   OBJECTIVE:  BP (!) 149/106   Pulse 84   Ht 4\' 11"  (1.499 m)   Wt 163 lb 3.2 oz (74 kg)   SpO2 100%   BMI 32.96 kg/m  Physical Exam Constitutional:      General: She is not in acute distress.    Appearance: Normal appearance. She is not ill-appearing.  Abdominal:     General: Bowel sounds are normal. There is no distension.     Palpations: Abdomen is soft. There is no mass.     Tenderness: There is no abdominal tenderness. There is no guarding or rebound.     Hernia: No hernia is present.  Neurological:     Mental Status: She is alert.  Psychiatric:        Mood and Affect: Mood normal.        Behavior: Behavior normal.      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  Patient has gallstones, noted on ultrasound to be 4.7 cm located in gallbladder neck.  Patient appreciates since changing diet symptoms have been better, take ibuprofen as needed for pain.  Patient denies any systemic symptoms, and feels pain is well-controlled.  Will make referral to GEN surge, as patient is wanting to be seen by general surgery, here. - Referral to Gen surg - Return precautions for fever, jaundice, uncontrolled abdominal pain, abdominal distention, inability to tolerate p.o. -Follow prn Elevated BP without diagnosis of  hypertension Patient noted to have elevated blood pressure today, was not able to recheck before conclusion of appointment.  Plan for recheck in the future.  Patient with no symptoms of chest pain, and doing well.  Plan to repeat blood pressure in future.  Given no history of hypertension, and age, low concern for true hypertension. - Repeat blood pressure in future No follow-ups on file. Bess Kinds, MD 07/06/2023, 9:14 PM PGY-3, [facility]

## 2023-07-04 ENCOUNTER — Encounter: Payer: Self-pay | Admitting: Student

## 2023-07-04 ENCOUNTER — Ambulatory Visit: Payer: Medicaid Other | Admitting: Student

## 2023-07-04 VITALS — BP 149/106 | HR 84 | Ht 59.0 in | Wt 163.2 lb

## 2023-07-04 DIAGNOSIS — R030 Elevated blood-pressure reading, without diagnosis of hypertension: Secondary | ICD-10-CM

## 2023-07-04 DIAGNOSIS — K8050 Calculus of bile duct without cholangitis or cholecystitis without obstruction: Secondary | ICD-10-CM

## 2023-07-06 DIAGNOSIS — K8050 Calculus of bile duct without cholangitis or cholecystitis without obstruction: Secondary | ICD-10-CM | POA: Insufficient documentation

## 2023-07-06 NOTE — Assessment & Plan Note (Addendum)
Patient comes in for resources for general surgery.  Patient has gallstones, noted on ultrasound to be 4.7 cm located in gallbladder neck.  Patient appreciates since changing diet symptoms have been better, take ibuprofen as needed for pain.  Patient denies any systemic symptoms, and feels pain is well-controlled.  Will make referral to GEN surge, as patient is wanting to be seen by general surgery, here. - Referral to Gen surg - Return precautions for fever, jaundice, uncontrolled abdominal pain, abdominal distention, inability to tolerate p.o. -Follow prn

## 2023-07-09 ENCOUNTER — Ambulatory Visit: Payer: Medicaid Other | Admitting: Student

## 2023-07-09 ENCOUNTER — Encounter: Payer: Self-pay | Admitting: Student

## 2023-07-09 VITALS — BP 125/89 | HR 98 | Temp 98.4°F | Ht 59.0 in | Wt 164.4 lb

## 2023-07-09 DIAGNOSIS — L0591 Pilonidal cyst without abscess: Secondary | ICD-10-CM | POA: Diagnosis present

## 2023-07-09 NOTE — Patient Instructions (Addendum)
It was great to see you! Thank you for allowing me to participate in your care!   I recommend that you always bring your medications to each appointment as this makes it easy to ensure we are on the correct medications and helps [place] not miss when refills are needed.  Our plans for today:  - Please call the surgeon below who performed your operation. - If you develop fevers, chills, nausea, vomiting, fast heart rate, shortness of breath please go to the emergency room.  Please call: Izetta Dakin, MD  [address]  [place], [place] [address]  [phone] (Work)  [phone] (Fax)     Take care and seek immediate care sooner if you develop any concerns. Please remember to show up 15 minutes before your scheduled appointment time!  Tiffany Kocher, DO [facility]

## 2023-07-09 NOTE — Progress Notes (Signed)
    SUBJECTIVE:   CHIEF COMPLAINT / HPI:   Symptomatic Pilonidal Cyst Patient underwent surgery in September 2020 pilonidal cystectomy in the OR.  Prior to surgery, she has had multiple I&D's of the lesion. She has been doing well up until the last few days when she suddenly developed new swelling and pain.  She denies fevers, chills, night sweats, nausea vomiting, tachycardia, shortness of breath, fatigue.   OBJECTIVE:   BP 125/89   Pulse 98   Temp 98.4 F (36.9 C)   Ht 4\' 11"  (1.499 m)   Wt 164 lb 6.4 oz (74.6 kg)   SpO2 99%   BMI 33.20 kg/m    General: NAD, pleasant Cardio: RRR, no MRG. Respiratory: CTAB, normal wob on RA Skin: Warm and dry.  Nondraining, tender pilonidal cyst ~2-3 cm.  No erythema, no warmth.  ASSESSMENT/PLAN:   Assessment & Plan Pilonidal cyst Painful pilonidal cyst.  Hemodynamically stable, low concern for systemic infection.  Given extensive history of surgeries, recommend she follow-up with her surgeon for consideration of I&D versus cystectomy.  Defer antibiotics at this time. - Number provided for surgeon - ED precautions discussed  Follow-up recommendations Recommend review of PHQ-9    Tiffany Kocher, DO [facility]

## 2023-07-12 NOTE — Assessment & Plan Note (Addendum)
Patient noted to have elevated blood pressure today, was not able to recheck before conclusion of appointment.  Plan for recheck in the future.  Patient with no symptoms of chest pain, and doing well.  Plan to repeat blood pressure in future.  Given no history of hypertension, and age, low concern for true hypertension. - Repeat blood pressure in future

## 2023-07-18 ENCOUNTER — Ambulatory Visit (INDEPENDENT_AMBULATORY_CARE_PROVIDER_SITE_OTHER): Payer: Medicaid Other | Admitting: Plastic Surgery

## 2023-07-18 ENCOUNTER — Encounter: Payer: Self-pay | Admitting: Plastic Surgery

## 2023-07-18 VITALS — BP 150/98 | HR 111 | Ht 59.0 in | Wt 161.4 lb

## 2023-07-18 DIAGNOSIS — M546 Pain in thoracic spine: Secondary | ICD-10-CM | POA: Diagnosis not present

## 2023-07-18 DIAGNOSIS — M542 Cervicalgia: Secondary | ICD-10-CM

## 2023-07-18 DIAGNOSIS — N62 Hypertrophy of breast: Secondary | ICD-10-CM

## 2023-07-18 DIAGNOSIS — Z6832 Body mass index (BMI) 32.0-32.9, adult: Secondary | ICD-10-CM

## 2023-07-18 NOTE — Progress Notes (Signed)
Referring Provider Westley Chandler, MD [address] [place],  [place] [address]   CC:  Chief Complaint  Patient presents with   Consult           Emily Hodges is an 27 y.o. female.  HPI: Emily Hodges is a 27 year old female who presents today with complaints of upper back and neck pain which she has had for 14 years.  She states that her pain began when she began developing breast.  She has been treated with Robaxin for the upper back and neck pain without significant relief.  She believes that her pain is caused by the large size of her breast and is interested in bilateral breast reduction.  She denies any significant breast issues though she did have a breast abscess which was drained approximately 1 year ago.  No Known Allergies  Outpatient Encounter Medications as of 07/18/2023  Medication Sig   etonogestrel (IMPLANON) 68 MG IMPL implant Inject 1 each (68 mg total) into the skin once.   fluticasone (FLONASE) 50 MCG/ACT nasal spray Place 2 sprays into both nostrils daily as needed for allergies or rhinitis.   methocarbamol (ROBAXIN) 750 MG tablet Take 2 tablets (1,500 mg total) by mouth every 8 (eight) hours as needed for muscle spasms.   No facility-administered encounter medications on file as of 07/18/2023.     Past Medical History:  Diagnosis Date   ADHD (attention deficit hyperactivity disorder)    GERD (gastroesophageal reflux disease)    Implanon in place     No past surgical history on file.  No family history on file.  Social History   Social History Narrative   Not on file     Review of Systems General: Denies fevers, chills, weight loss CV: Denies chest pain, shortness of breath, palpitations Breast: Patient feels that the large size of her breast is contributing to her upper back and neck pain. GI: Patient is currently being evaluated for right upper quadrant pain and has known cholelithiasis Skin: Patient has been treated in the past for a  pilonidal cyst and is recently had a flare of pain from this.  Physical Exam    07/18/2023   12:57 PM 07/09/2023    3:17 PM 07/04/2023    3:25 PM  Vitals with BMI  Height 4\' 11"  4\' 11"  4\' 11"   Weight 161 lbs 6 oz 164 lbs 6 oz 163 lbs 3 oz  BMI 32.58 33.19 32.94  Systolic 150 125 540  Diastolic 98 89 106  Pulse 111 98 84    General:  No acute distress,  Alert and oriented, Non-Toxic, Normal speech and affect Breast: Patient has moderately large bilateral pendulous breast.  On physical exam I do not detect any dominant masses.  The nipples are normal in appearance without any evidence of nipple discharge today.  Her sternal notch to nipple distance on the right is 34 cm and 35 cm on the left her fold to nipple distance on the right is 16 cm and 16 cm on the left Mammogram: Not applicable due to age Assessment/Plan Macromastia: Patient has moderately large breast and would likely benefit from a bilateral breast reduction.  I believe I can remove 400 g per breast.  Gust the procedure at length including the location of the incisions and the unpredictable nature of scarring and wound healing.  Specifically she and her mother were asking about keloids which we discussed at length.  They understand that there is no way  to predict with 100% certainty whether keloids will develop after the surgery.  We did discuss strategies for managing scars and we did discuss the fact that she has not had any keloids with prior surgeries or with ear piercings or tattoos.  We discussed the risks of bleeding, infection, and seroma formation.  She understands that she will have drains postoperatively.  She also understands that she will need to wear a compressive supportive garment for 6 weeks.  We discussed the risk of nipple loss due to nipple ischemia.  We discussed the postoperative limitations including no heavy lifting greater than 20 pounds, no vigorous activity, and no submerging the incisions in water.  We  discussed the importance of early ambulation after surgery to help prevent DVT.  She may return to light activity as tolerated after surgery.  All questions were answered to her satisfaction.  Photographs were obtained today with her consent.  Will submit her for bilateral breast reduction at her request.  I did ask that her cholelithiasis and her pilonidal cyst be addressed and if surgical intervention was warranted that this be taken care of prior to scheduling her breast reduction.  Santiago Glad 07/18/2023, 1:58 PM

## 2023-07-22 ENCOUNTER — Ambulatory Visit: Payer: Medicaid Other

## 2023-08-08 ENCOUNTER — Other Ambulatory Visit: Payer: Self-pay | Admitting: Student

## 2023-08-09 NOTE — Progress Notes (Signed)
Sent message, via epic in basket, requesting orders in epic from surgeon.  

## 2023-08-09 NOTE — Patient Instructions (Signed)
SURGICAL WAITING ROOM VISITATION  Patients having surgery or a procedure may have no more than 2 support people in the waiting area - these visitors may rotate.    Children under the age of 45 must have an adult with them who is not the patient.  Due to an increase in RSV and influenza rates and associated hospitalizations, children ages 10 and under may not visit patients in [facility] hospitals.  If the patient needs to stay at the hospital during part of their recovery, the visitor guidelines for inpatient rooms apply. Pre-op nurse will coordinate an appropriate time for 1 support person to accompany patient in pre-op.  This support person may not rotate.    Please refer to the [facility] website for the visitor guidelines for Inpatients (after your surgery is over and you are in a regular room).       Your procedure is scheduled on: Thursday, Dec. 19, 2024   Report to [facility] Main Entrance    Report to admitting at  9:15 AM   Call this number if you have problems the morning of surgery [phone]   Do not eat food :After Midnight.   After Midnight you may have the following liquids until ______ AM/ PM DAY OF SURGERY  Water Non-Citrus Juices (without pulp, NO RED-Apple, White grape, White cranberry) Black Coffee (NO MILK/CREAM OR CREAMERS, sugar ok)  Clear Tea (NO MILK/CREAM OR CREAMERS, sugar ok) regular and decaf                             Plain Jell-O (NO RED)                                           Fruit ices (not with fruit pulp, NO RED)                                     Popsicles (NO RED)                                                               Sports drinks like Gatorade (NO RED)              Drink 2 Ensure/G2 drinks AT 10:00 PM the night before surgery.        The day of surgery:  Drink ONE (1) Pre-Surgery Clear Ensure or G2 at AM the morning of surgery. Drink in one sitting. Do not sip.  This drink was given to you during your hospital   pre-op appointment visit. Nothing else to drink after completing the  Pre-Surgery Clear Ensure or G2.          If you have questions, please contact your surgeon's office.   FOLLOW BOWEL PREP AND ANY ADDITIONAL PRE OP INSTRUCTIONS YOU RECEIVED FROM YOUR SURGEON'S OFFICE!!!     Oral Hygiene is also important to reduce your risk of infection.  Remember - BRUSH YOUR TEETH THE MORNING OF SURGERY WITH YOUR REGULAR TOOTHPASTE  DENTURES WILL BE REMOVED PRIOR TO SURGERY PLEASE DO NOT APPLY "Poly grip" OR ADHESIVES!!!   Do NOT smoke after Midnight   Stop all vitamins and herbal supplements 7 days before surgery.   Take these medicines the morning of surgery with A SIP OF WATER:   DO NOT TAKE ANY ORAL DIABETIC MEDICATIONS DAY OF YOUR SURGERY  Bring CPAP mask and tubing day of surgery.                              You may not have any metal on your body including hair pins, jewelry, and body piercing             Do not wear make-up, lotions, powders, perfumes/cologne, or deodorant  Do not wear nail polish including gel and S&S, artificial/acrylic nails, or any other type of covering on natural nails including finger and toenails. If you have artificial nails, gel coating, etc. that needs to be removed by a nail salon please have this removed prior to surgery or surgery may need to be canceled/ delayed if the surgeon/ anesthesia feels like they are unable to be safely monitored.   Do not shave  48 hours prior to surgery.               Men may shave face and neck.   Do not bring valuables to the hospital. [facility] IS NOT             RESPONSIBLE   FOR VALUABLES.   Contacts, glasses, dentures or bridgework may not be worn into surgery.   Bring small overnight bag day of surgery.   DO NOT BRING YOUR HOME MEDICATIONS TO THE HOSPITAL. PHARMACY WILL DISPENSE MEDICATIONS LISTED ON YOUR MEDICATION LIST TO YOU DURING YOUR ADMISSION IN THE  HOSPITAL!    Patients discharged on the day of surgery will not be allowed to drive home.  Someone NEEDS to stay with you for the first 24 hours after anesthesia.   Special Instructions: Bring a copy of your healthcare power of attorney and living will documents the day of surgery if you haven't scanned them before.              Please read over the following fact sheets you were given: IF YOU HAVE QUESTIONS ABOUT YOUR PRE-OP INSTRUCTIONS PLEASE CALL [phone]   If you received a COVID test during your pre-op visit  it is requested that you wear a mask when out in public, stay away from anyone that may not be feeling well and notify your surgeon if you develop symptoms. If you test positive for Covid or have been in contact with anyone that has tested positive in the last 10 days please notify you surgeon.     - Preparing for Surgery Before surgery, you can play an important role.  Because skin is not sterile, your skin needs to be as free of germs as possible.  You can reduce the number of germs on your skin by washing with CHG (chlorahexidine gluconate) soap before surgery.  CHG is an antiseptic cleaner which kills germs and bonds with the skin to continue killing germs even after washing. Please DO NOT use if you have an allergy to CHG or antibacterial soaps.  If your skin becomes reddened/irritated stop using the CHG and inform your nurse when you arrive at  Short Stay. Do not shave (including legs and underarms) for at least 48 hours prior to the first CHG shower.  You may shave your face/neck.  Please follow these instructions carefully:  1.  Shower with CHG Soap the night before surgery and the  morning of surgery.  2.  If you choose to wash your hair, wash your hair first as usual with your normal  shampoo.  3.  After you shampoo, rinse your hair and body thoroughly to remove the shampoo.                             4.  Use CHG as you would any other liquid soap.  You can  apply chg directly to the skin and wash.  Gently with a scrungie or clean washcloth.  5.  Apply the CHG Soap to your body ONLY FROM THE NECK DOWN.   Do   not use on face/ open                           Wound or open sores. Avoid contact with eyes, ears mouth and   genitals (private parts).                       Wash face,  Genitals (private parts) with your normal soap.             6.  Wash thoroughly, paying special attention to the area where your    surgery  will be performed.  7.  Thoroughly rinse your body with warm water from the neck down.  8.  DO NOT shower/wash with your normal soap after using and rinsing off the CHG Soap.                9.  Pat yourself dry with a clean towel.            10.  Wear clean pajamas.            11.  Place clean sheets on your bed the night of your first shower and do not  sleep with pets. Day of Surgery : Do not apply any lotions/deodorants the morning of surgery.  Please wear clean clothes to the hospital/surgery center.  FAILURE TO FOLLOW THESE INSTRUCTIONS MAY RESULT IN THE CANCELLATION OF YOUR SURGERY  PATIENT SIGNATURE_________________________________  NURSE SIGNATURE__________________________________  ________________________________________________________________________

## 2023-08-12 ENCOUNTER — Encounter (HOSPITAL_COMMUNITY): Payer: Self-pay

## 2023-08-12 ENCOUNTER — Encounter (HOSPITAL_COMMUNITY)
Admission: RE | Admit: 2023-08-12 | Discharge: 2023-08-12 | Disposition: A | Discharge status: Home / Self Care | Payer: Self-pay | Source: Ambulatory Visit | Attending: General Surgery | Admitting: General Surgery

## 2023-08-12 ENCOUNTER — Other Ambulatory Visit: Payer: Self-pay

## 2023-08-12 ENCOUNTER — Ambulatory Visit: Payer: Self-pay | Admitting: General Surgery

## 2023-08-12 VITALS — BP 130/89 | HR 96 | Temp 99.5°F | Resp 16 | Ht 59.0 in | Wt 161.0 lb

## 2023-08-12 DIAGNOSIS — Z01818 Encounter for other preprocedural examination: Secondary | ICD-10-CM | POA: Insufficient documentation

## 2023-08-12 NOTE — Progress Notes (Addendum)
PCP - [facility] LOV 07-09-23 epic Cardiologist - no  PPM/ICD -  Device Orders -  Rep Notified -   Chest x-ray -  EKG - 06-30-23   CE Stress Test -  ECHO -  Cardiac Cath -   Sleep Study -  CPAP -   Fasting Blood Sugar -  Checks Blood Sugar _____ times a day  Blood Thinner Instructions: Aspirin Instructions:  ERAS Protcol - PRE-SURGERY   COVID vaccine -no  Activity--Able to climb a flight of stairs without CP or SOB Anesthesia review:   Patient denies shortness of breath, fever, cough and chest pain at PAT appointment   All instructions explained to the patient, with a verbal understanding of the material. Patient agrees to go over the instructions while at home for a better understanding. Patient also instructed to self quarantine after being tested for COVID-19. The opportunity to ask questions was provided.

## 2023-08-13 ENCOUNTER — Telehealth: Payer: Self-pay | Admitting: *Deleted

## 2023-08-13 NOTE — Telephone Encounter (Signed)
Received a call from [facility] in operative pre-service letting us know that patient's medicaid has lapsed for our planned surgery on 09/20/23.   Contacted patient, she is aware of lapse. Notified her that if covered is still inactive 08/28/23 we will have to postpone her case until coverage is active again and new authorization can be obtained.

## 2023-08-15 ENCOUNTER — Ambulatory Visit (HOSPITAL_COMMUNITY)
Admission: RE | Admit: 2023-08-15 | Discharge: 2023-08-15 | Disposition: A | Discharge status: Home / Self Care | Payer: Medicaid Other | Attending: General Surgery | Admitting: General Surgery

## 2023-08-15 ENCOUNTER — Other Ambulatory Visit: Payer: Self-pay

## 2023-08-15 ENCOUNTER — Encounter (HOSPITAL_COMMUNITY)
Admission: RE | Disposition: A | Discharge status: Home / Self Care | Payer: Self-pay | Source: Home / Self Care | Attending: General Surgery

## 2023-08-15 ENCOUNTER — Encounter (HOSPITAL_COMMUNITY): Payer: Self-pay | Admitting: General Surgery

## 2023-08-15 ENCOUNTER — Ambulatory Visit (HOSPITAL_BASED_OUTPATIENT_CLINIC_OR_DEPARTMENT_OTHER): Payer: Medicaid Other | Admitting: Certified Registered"

## 2023-08-15 ENCOUNTER — Ambulatory Visit (HOSPITAL_COMMUNITY): Payer: Medicaid Other | Admitting: Certified Registered"

## 2023-08-15 DIAGNOSIS — K8010 Calculus of gallbladder with chronic cholecystitis without obstruction: Secondary | ICD-10-CM | POA: Diagnosis present

## 2023-08-15 DIAGNOSIS — Z01818 Encounter for other preprocedural examination: Secondary | ICD-10-CM

## 2023-08-15 DIAGNOSIS — K8020 Calculus of gallbladder without cholecystitis without obstruction: Secondary | ICD-10-CM

## 2023-08-15 HISTORY — PX: CHOLECYSTECTOMY: SHX55

## 2023-08-15 LAB — POCT PREGNANCY, URINE: Preg Test, Ur: NEGATIVE

## 2023-08-15 SURGERY — LAPAROSCOPIC CHOLECYSTECTOMY
Anesthesia: General

## 2023-08-15 MED ORDER — AMISULPRIDE (ANTIEMETIC) 5 MG/2ML IV SOLN: 10.0000 mg | Freq: Once | INTRAVENOUS | Status: DC | PRN

## 2023-08-15 MED ORDER — KETOROLAC TROMETHAMINE 30 MG/ML IJ SOLN
INTRAMUSCULAR | Status: DC | PRN
  Administered 2023-08-15: 30 mg via INTRAVENOUS

## 2023-08-15 MED ORDER — KETAMINE HCL 10 MG/ML IJ SOLN
INTRAMUSCULAR | Status: DC | PRN
  Administered 2023-08-15: 25 mg via INTRAVENOUS

## 2023-08-15 MED ORDER — ACETAMINOPHEN 500 MG PO TABS
1000.0000 mg | ORAL_TABLET | ORAL | Status: AC
Start: 2023-08-15 — End: 2023-08-15
  Administered 2023-08-15: 1000 mg via ORAL
  Filled 2023-08-15: qty 2

## 2023-08-15 MED ORDER — STERILE WATER FOR IRRIGATION IR SOLN
Status: DC | PRN
  Administered 2023-08-15: 1000 mL

## 2023-08-15 MED ORDER — SODIUM CHLORIDE 0.9 % IV SOLN
2.0000 g | INTRAVENOUS | Status: AC
  Administered 2023-08-15: 2 g via INTRAVENOUS
  Filled 2023-08-15: qty 2

## 2023-08-15 MED ORDER — LIDOCAINE 2% (20 MG/ML) 5 ML SYRINGE
INTRAMUSCULAR | Status: DC | PRN
  Administered 2023-08-15: 60 mg via INTRAVENOUS

## 2023-08-15 MED ORDER — ONDANSETRON HCL 4 MG/2ML IJ SOLN
INTRAMUSCULAR | Status: DC | PRN
  Administered 2023-08-15: 4 mg via INTRAVENOUS

## 2023-08-15 MED ORDER — DEXAMETHASONE SODIUM PHOSPHATE 10 MG/ML IJ SOLN
INTRAMUSCULAR | Status: DC | PRN
  Administered 2023-08-15: 10 mg via INTRAVENOUS

## 2023-08-15 MED ORDER — FENTANYL CITRATE (PF) 100 MCG/2ML IJ SOLN
INTRAMUSCULAR | Status: DC | PRN
  Administered 2023-08-15: 50 ug via INTRAVENOUS
  Administered 2023-08-15: 100 ug via INTRAVENOUS

## 2023-08-15 MED ORDER — MEPERIDINE HCL 50 MG/ML IJ SOLN: 6.2500 mg | INTRAMUSCULAR | Status: DC | PRN

## 2023-08-15 MED ORDER — OXYCODONE HCL 5 MG PO TABS: 5.0000 mg | ORAL_TABLET | Freq: Four times a day (QID) | ORAL | 0 refills | Status: AC | PRN

## 2023-08-15 MED ORDER — SCOPOLAMINE 1 MG/3DAYS TD PT72
MEDICATED_PATCH | TRANSDERMAL | Status: AC
  Filled 2023-08-15: qty 1

## 2023-08-15 MED ORDER — HYDROMORPHONE HCL 1 MG/ML IJ SOLN
0.2500 mg | INTRAMUSCULAR | Status: DC | PRN
Start: 2023-08-15 — End: 2023-08-15

## 2023-08-15 MED ORDER — ROCURONIUM BROMIDE 10 MG/ML (PF) SYRINGE
PREFILLED_SYRINGE | INTRAVENOUS | Status: DC | PRN
  Administered 2023-08-15: 60 mg via INTRAVENOUS

## 2023-08-15 MED ORDER — BUPIVACAINE-EPINEPHRINE 0.25% -1:200000 IJ SOLN
INTRAMUSCULAR | Status: AC
  Filled 2023-08-15: qty 1

## 2023-08-15 MED ORDER — MIDAZOLAM HCL 2 MG/2ML IJ SOLN
INTRAMUSCULAR | Status: AC
Start: 2023-08-15 — End: ?
  Filled 2023-08-15: qty 2

## 2023-08-15 MED ORDER — FENTANYL CITRATE (PF) 100 MCG/2ML IJ SOLN
INTRAMUSCULAR | Status: AC
  Filled 2023-08-15: qty 2

## 2023-08-15 MED ORDER — PROPOFOL 10 MG/ML IV BOLUS
INTRAVENOUS | Status: DC | PRN
  Administered 2023-08-15: 150 mg via INTRAVENOUS

## 2023-08-15 MED ORDER — CHLORHEXIDINE GLUCONATE 0.12 % MT SOLN
15.0000 mL | Freq: Once | OROMUCOSAL | Status: AC
  Administered 2023-08-15: 15 mL via OROMUCOSAL

## 2023-08-15 MED ORDER — SODIUM CHLORIDE 0.9 % IV SOLN: 12.5000 mg | INTRAVENOUS | Status: DC | PRN

## 2023-08-15 MED ORDER — ORAL CARE MOUTH RINSE: 15.0000 mL | Freq: Once | OROMUCOSAL | Status: AC

## 2023-08-15 MED ORDER — BUPIVACAINE-EPINEPHRINE 0.25% -1:200000 IJ SOLN
INTRAMUSCULAR | Status: DC | PRN
  Administered 2023-08-15: 50 mL

## 2023-08-15 MED ORDER — ACETAMINOPHEN 500 MG PO TABS: 1000.0000 mg | ORAL_TABLET | Freq: Three times a day (TID) | ORAL | Status: AC

## 2023-08-15 MED ORDER — SPY AGENT GREEN - (INDOCYANINE FOR INJECTION)
1.2500 mg | Freq: Once | INTRAMUSCULAR | Status: AC
  Administered 2023-08-15: 1.25 mg via INTRAVENOUS
  Filled 2023-08-15: qty 10

## 2023-08-15 MED ORDER — PROPOFOL 10 MG/ML IV BOLUS
INTRAVENOUS | Status: AC
  Filled 2023-08-15: qty 20

## 2023-08-15 MED ORDER — OXYCODONE HCL 5 MG/5ML PO SOLN: 5.0000 mg | Freq: Once | ORAL | Status: DC | PRN

## 2023-08-15 MED ORDER — SCOPOLAMINE 1 MG/3DAYS TD PT72
MEDICATED_PATCH | TRANSDERMAL | Status: DC | PRN
  Administered 2023-08-15: 1 via TRANSDERMAL

## 2023-08-15 MED ORDER — OXYCODONE HCL 5 MG PO TABS
5.0000 mg | ORAL_TABLET | Freq: Once | ORAL | Status: DC | PRN
Start: 2023-08-15 — End: 2023-08-15

## 2023-08-15 MED ORDER — MIDAZOLAM HCL 5 MG/5ML IJ SOLN
INTRAMUSCULAR | Status: DC | PRN
  Administered 2023-08-15: 2 mg via INTRAVENOUS

## 2023-08-15 MED ORDER — CHLORHEXIDINE GLUCONATE CLOTH 2 % EX PADS: 6.0000 | MEDICATED_PAD | Freq: Once | CUTANEOUS | Status: DC

## 2023-08-15 MED ORDER — KETAMINE HCL 50 MG/5ML IJ SOSY
PREFILLED_SYRINGE | INTRAMUSCULAR | Status: AC
  Filled 2023-08-15: qty 5

## 2023-08-15 MED ORDER — PHENYLEPHRINE HCL (PRESSORS) 10 MG/ML IV SOLN
INTRAVENOUS | Status: DC | PRN
  Administered 2023-08-15: 160 ug via INTRAVENOUS

## 2023-08-15 MED ORDER — SUGAMMADEX SODIUM 200 MG/2ML IV SOLN
INTRAVENOUS | Status: DC | PRN
  Administered 2023-08-15: 200 mg via INTRAVENOUS

## 2023-08-15 MED ORDER — LACTATED RINGERS IV SOLN
INTRAVENOUS | Status: DC
Start: 2023-08-15 — End: 2023-08-15

## 2023-08-15 MED ORDER — DROPERIDOL 2.5 MG/ML IJ SOLN
INTRAMUSCULAR | Status: DC | PRN
  Administered 2023-08-15: .625 mg via INTRAVENOUS

## 2023-08-15 SURGICAL SUPPLY — 46 items
APPLICATOR ARISTA FLEXITIP XL (MISCELLANEOUS) IMPLANT
APPLIER CLIP 5 13 M/L LIGAMAX5 (MISCELLANEOUS) ×1
APPLIER CLIP ROT 10 11.4 M/L (STAPLE)
BAG COUNTER SPONGE SURGICOUNT (BAG) IMPLANT
CABLE HIGH FREQUENCY MONO STRZ (ELECTRODE) ×1 IMPLANT
CHLORAPREP W/TINT 26 (MISCELLANEOUS) ×1 IMPLANT
CLIP APPLIE 5 13 M/L LIGAMAX5 (MISCELLANEOUS) IMPLANT
CLIP APPLIE ROT 10 11.4 M/L (STAPLE) IMPLANT
CLIP LIGATING HEMO O LOK GREEN (MISCELLANEOUS) IMPLANT
COVER MAYO STAND XLG (MISCELLANEOUS) IMPLANT
COVER SURGICAL LIGHT HANDLE (MISCELLANEOUS) ×1 IMPLANT
DERMABOND ADVANCED .7 DNX12 (GAUZE/BANDAGES/DRESSINGS) IMPLANT
DRAPE C-ARM 42X120 X-RAY (DRAPES) IMPLANT
DRSG TEGADERM 2-3/8X2-3/4 SM (GAUZE/BANDAGES/DRESSINGS) ×3 IMPLANT
DRSG TEGADERM 4X4.75 (GAUZE/BANDAGES/DRESSINGS) ×1 IMPLANT
ELECT REM PT RETURN 15FT ADLT (MISCELLANEOUS) ×1 IMPLANT
GAUZE SPONGE 2X2 8PLY STRL LF (GAUZE/BANDAGES/DRESSINGS) ×1 IMPLANT
GLOVE BIO SURGEON STRL SZ7.5 (GLOVE) ×1 IMPLANT
GLOVE INDICATOR 8.0 STRL GRN (GLOVE) ×1 IMPLANT
GOWN STRL REUS W/ TWL XL LVL3 (GOWN DISPOSABLE) ×1 IMPLANT
GRASPER SUT TROCAR 14GX15 (MISCELLANEOUS) IMPLANT
HEMOSTAT ARISTA ABSORB 3G PWDR (HEMOSTASIS) IMPLANT
HEMOSTAT SNOW SURGICEL 2X4 (HEMOSTASIS) IMPLANT
IRRIG SUCT STRYKERFLOW 2 WTIP (MISCELLANEOUS) ×1
IRRIGATION SUCT STRKRFLW 2 WTP (MISCELLANEOUS) ×1 IMPLANT
KIT BASIN OR (CUSTOM PROCEDURE TRAY) ×1 IMPLANT
KIT TURNOVER KIT A (KITS) IMPLANT
L-HOOK LAP DISP 36CM (ELECTROSURGICAL)
LHOOK LAP DISP 36CM (ELECTROSURGICAL) IMPLANT
POUCH RETRIEVAL ECOSAC 10 (ENDOMECHANICALS) ×1 IMPLANT
SCISSORS LAP 5X35 DISP (ENDOMECHANICALS) ×1 IMPLANT
SET CHOLANGIOGRAPH MIX (MISCELLANEOUS) IMPLANT
SET TUBE SMOKE EVAC HIGH FLOW (TUBING) ×1 IMPLANT
SLEEVE ADV FIXATION 5X100MM (TROCAR) ×1 IMPLANT
SPIKE FLUID TRANSFER (MISCELLANEOUS) ×1 IMPLANT
STRIP CLOSURE SKIN 1/2X4 (GAUZE/BANDAGES/DRESSINGS) ×1 IMPLANT
SUT MNCRL AB 4-0 PS2 18 (SUTURE) ×1 IMPLANT
SUT VIC AB 0 UR5 27 (SUTURE) IMPLANT
SUT VICRYL 0 TIES 12 18 (SUTURE) IMPLANT
SUT VICRYL 0 UR6 27IN ABS (SUTURE) IMPLANT
TOWEL OR 17X26 10 PK STRL BLUE (TOWEL DISPOSABLE) ×1 IMPLANT
TRAY LAPAROSCOPIC (CUSTOM PROCEDURE TRAY) ×1 IMPLANT
TROCAR ADV FIXATION 12X100MM (TROCAR) IMPLANT
TROCAR ADV FIXATION 5X100MM (TROCAR) ×1 IMPLANT
TROCAR BALLN 12MMX100 BLUNT (TROCAR) IMPLANT
TROCAR XCEL NON-BLD 5MMX100MML (ENDOMECHANICALS) IMPLANT

## 2023-08-15 NOTE — Transfer of Care (Signed)
Immediate Anesthesia Transfer of Care Note  Patient: Emily Hodges  Procedure(s) Performed: LAPAROSCOPIC CHOLECYSTECTOMY WITH ICG DYE  Patient Location: PACU  Anesthesia Type:General  Level of Consciousness: awake, alert , oriented, and patient cooperative  Airway & Oxygen Therapy: Patient Spontanous Breathing and Patient connected to face mask oxygen  Post-op Assessment: Report given to RN and Post -op Vital signs reviewed and stable  Post vital signs: Reviewed and stable  Last Vitals:  Vitals Value Taken Time  BP 150/97 08/15/23 1215  Temp    Pulse 97 08/15/23 1218  Resp 28 08/15/23 1218  SpO2 98 % 08/15/23 1218  Vitals shown include unfiled device data.  Last Pain:  Vitals:   08/15/23 1215  TempSrc:   PainSc: Asleep         Complications: No notable events documented.

## 2023-08-15 NOTE — Anesthesia Preprocedure Evaluation (Signed)
Anesthesia Evaluation  Patient identified by MRN, date of birth, ID band Patient awake    Reviewed: Allergy & Precautions, H&P , NPO status , Patient's Chart, lab work & pertinent test results  Airway Mallampati: II  TM Distance: >3 FB Neck ROM: Full    Dental no notable dental hx.    Pulmonary neg pulmonary ROS   Pulmonary exam normal breath sounds clear to auscultation       Cardiovascular negative cardio ROS Normal cardiovascular exam Rhythm:Regular Rate:Normal     Neuro/Psych  Headaches  negative psych ROS   GI/Hepatic negative GI ROS, Neg liver ROS,,,  Endo/Other  negative endocrine ROS    Renal/GU negative Renal ROS  negative genitourinary   Musculoskeletal negative musculoskeletal ROS (+)    Abdominal  (+) + obese  Peds negative pediatric ROS (+)  Hematology negative hematology ROS (+)   Anesthesia Other Findings   Reproductive/Obstetrics negative OB ROS                             Anesthesia Physical Anesthesia Plan  ASA: 2  Anesthesia Plan: General   Post-op Pain Management: Dilaudid IV   Induction: Intravenous  PONV Risk Score and Plan: 3 and Ondansetron, Dexamethasone, Midazolam and Treatment may vary due to age or medical condition  Airway Management Planned: Oral ETT  Additional Equipment:   Intra-op Plan:   Post-operative Plan: Extubation in OR  Informed Consent: I have reviewed the patients History and Physical, chart, labs and discussed the procedure including the risks, benefits and alternatives for the proposed anesthesia with the patient or authorized representative who has indicated his/her understanding and acceptance.     Dental advisory given  Plan Discussed with: CRNA  Anesthesia Plan Comments:        Anesthesia Quick Evaluation

## 2023-08-15 NOTE — Discharge Instructions (Signed)

## 2023-08-15 NOTE — H&P (Signed)
REFERRING PHYSICIAN: Terisa Starr, MD  PROVIDER: Vyctoria Dickman Sherril Cong, MD  MRN: [id number] DOB: 1996-07-30 DATE OF ENCOUNTER: 08/08/2023  Subjective  Chief Complaint: New Consultation (Eval of GB,)   History of Present Illness: Emily Hodges is a 27 y.o. female who is seen today as an office consultation at the request of Dr. Manson Passey for evaluation of New Consultation (Eval of GB,) .  The patient went to [facility] urgent care on November 3 complaining of 2-week history of pain in the right upper abdomen with radiation to the upper back and scapula. Associated with intermittent nausea and vomiting. Took over-the-counter meclizine without much relief. She had ultrasound and lab work done. She was found to have evidence of a large gallstone. She saw her primary care team a little bit later and had changed her food choices and her symptoms were improved.  She states that since modifying her diet she has had decreased episodes. Her last episode was Sunday however she ate a little bit of a heavy meal. The discomfort lasted about 2 to 3 hours and was associate with nausea. It did not radiate. No fever or chills.    Review of Systems: A complete review of systems was obtained from the patient. I have reviewed this information and discussed as appropriate with the patient. See HPI as well for other ROS.  ROS  Medical History: History reviewed. No pertinent past medical history.  There is no problem list on file for this patient.  Past Surgical History: Procedure Laterality Date PILONIDAL CYST / SINUS EXCISION   No Known Allergies  Current Outpatient Medications on File Prior to Visit Medication Sig Dispense Refill etonogestreL (NEXPLANON) 68 mg implant Inject 1 each into the skin once fluticasone propionate (FLONASE) 50 mcg/actuation nasal spray Place 2 sprays into one nostril once daily as needed methocarbamoL (ROBAXIN) 750 MG tablet Take 1,500 mg by mouth ondansetron (ZOFRAN-ODT) 4  MG disintegrating tablet DISSOLVE 1 TABLET (4 MG TOTAL) ON TONGUE EVERY 8 HOURS AS NEEDED FOR NAUSEA AND VOMITING  No current facility-administered medications on file prior to visit.  Family History Problem Relation Age of Onset Deep vein thrombosis (DVT or abnormal blood clot formation) Mother Colon cancer Mother Breast cancer Mother   Social History  Tobacco Use Smoking Status Former Types: Cigarettes Start date: 2018 Smokeless Tobacco Never   Social History  Socioeconomic History Marital status: Single Tobacco Use Smoking status: Former Types: Cigarettes Start date: 2018 Smokeless tobacco: Never Substance and Sexual Activity Alcohol use: Not Currently Drug use: Never  Social Drivers of Health  Received from [employer]  Objective:  Vitals: 08/08/23 1326 BP: (!) 144/88 Pulse: 100 Temp: 36.7 C (98 F) SpO2: 98% Weight: 74.8 kg (165 lb)  There is no height or weight on file to calculate BMI.  Constitutional: NAD; conversant; no deformities Eyes: Moist conjunctiva; no lid lag; anicteric; PERRL Neck: Trachea midline; no thyromegaly Lungs: Normal respiratory effort; no tactile fremitus CV: RRR; no palpable thrills; no pitting edema GI: Abd, nontender, nondistended; no palpable hepatosplenomegaly MSK: Normal gait; no clubbing/cyanosis Psychiatric: Appropriate affect; alert and oriented x3 Lymphatic: No palpable cervical or axillary lymphadenopathy Skin: No rash, lesions or jaundice  Labs, Imaging and Diagnostic Testing: Pcp note 07/04/23 [facility] urgent care note 06/30/23  Abdominal ultrasound showing cholelithiasis and biliary sludge without cholecystitis. Large gallstone measuring 4.7 cm within the gallbladder neck.06/30/23  Abdominal x-ray June 30, 2023  Labs November 3 essentially normal CBC, comprehensive metabolic panel Assessment and Plan:  Diagnoses and all orders for this visit:  Symptomatic cholelithiasis    I  believe the patient's symptoms are consistent with gallbladder disease.  We discussed gallbladder disease. The patient was given Agricultural engineer. We discussed non-operative and operative management. We discussed the signs & symptoms of acute cholecystitis  I discussed laparoscopic cholecystectomy with possible IOC in detail. The patient was given educational material as well as diagrams detailing the procedure. We discussed the risks and benefits of a laparoscopic cholecystectomy including, but not limited to bleeding, infection, injury to surrounding structures such as the intestine or liver, bile leak, retained gallstones, need to convert to an open procedure, prolonged diarrhea, blood clots such as DVT, common bile duct injury, anesthesia risks, and possible need for additional procedures. We discussed the typical post-operative recovery course. I explained that the likelihood of improvement of their symptoms is good.  This patient encounter took 30 minutes today to perform the following: take history, perform exam, review outside records, interpret imaging, counsel the patient on their diagnosis and document encounter, findings & plan in the EHR  No follow-ups on file.  Katlin Ciszewski Sherril Cong, MD General, Minimally Invasive, & Bariatric Surgery     Electronically signed by Gara Kroner, MD at 08/08/2023 1:51 PM EST

## 2023-08-15 NOTE — Anesthesia Procedure Notes (Signed)
Procedure Name: Intubation Date/Time: 08/15/2023 1:00 AM  Performed by: Ponciano Ort, CRNAPre-anesthesia Checklist: Patient identified, Emergency Drugs available, Suction available and Patient being monitored Patient Re-evaluated:Patient Re-evaluated prior to induction Oxygen Delivery Method: Circle system utilized Preoxygenation: Pre-oxygenation with 100% oxygen Induction Type: IV induction Ventilation: Mask ventilation without difficulty Laryngoscope Size: Glidescope and 3 (Glidescope used for training.) Grade View: Grade I Tube type: Oral (Intubated by Ricky Stabs, RN under Dr. Rondel Baton supervision.) Tube size: 7.0 mm Number of attempts: 1 Airway Equipment and Method: Stylet and Oral airway Placement Confirmation: ETT inserted through vocal cords under direct vision, positive ETCO2 and breath sounds checked- equal and bilateral Secured at: 20 cm Tube secured with: Tape Dental Injury: Teeth and Oropharynx as per pre-operative assessment

## 2023-08-15 NOTE — Interval H&P Note (Signed)
History and Physical Interval Note:  08/15/2023 10:34 AM  Emily Hodges  has presented today for surgery, with the diagnosis of SYMPTOMATIC CHOLELITHIASIS.  The various methods of treatment have been discussed with the patient and family. After consideration of risks, benefits and other options for treatment, the patient has consented to  Procedure(s): LAPAROSCOPIC CHOLECYSTECTOMY WITH ICG DYE (N/A) as a surgical intervention.  The patient's history has been reviewed, patient examined, no change in status, stable for surgery.  I have reviewed the patient's chart and labs.  Questions were answered to the patient's satisfaction.     Gaynelle Adu

## 2023-08-15 NOTE — Op Note (Signed)
Emily Hodges [id number] [birth year] 08/15/2023  Laparoscopic Cholecystectomy with near infrared fluorescent cholangiography procedure Note  Indications: This patient presents with symptomatic gallbladder disease and will undergo laparoscopic cholecystectomy.  Pre-operative Diagnosis: symptomatic cholelithiasis  Post-operative Diagnosis: Same, probable chronic cholecystitis  Surgeon: Gaynelle Adu MD FACS  Assistants: none  Anesthesia: General endotracheal anesthesia  Procedure Details  The patient was seen again in the Holding Room. The risks, benefits, complications, treatment options, and expected outcomes were discussed with the patient. The possibilities of reaction to medication, pulmonary aspiration, perforation of viscus, bleeding, recurrent infection, finding a normal gallbladder, the need for additional procedures, failure to diagnose a condition, the possible need to convert to an open procedure, and creating a complication requiring transfusion or operation were discussed with the patient. The likelihood of improving the patient's symptoms with return to their baseline status is good.  The patient and/or family concurred with the proposed plan, giving informed consent. The site of surgery properly noted. The patient was taken to Operating Room, identified as Emily Hodges and the procedure verified as Laparoscopic Cholecystectomy with ICG dye.  A Time Out was held and the above information confirmed. Antibiotic prophylaxis was administered.    ICG dye was administered preoperatively.    General endotracheal anesthesia was then administered and tolerated well. After the induction, the abdomen was prepped with Chloraprep and draped in the sterile fashion. The patient was positioned in the supine position.  Local anesthetic agent was injected into the skin near the umbilicus and an incision made. We dissected down to the abdominal fascia with blunt dissection.  The fascia was  incised vertically and we entered the peritoneal cavity bluntly.  A pursestring suture of 0-Vicryl was placed around the fascial opening.  The Hasson cannula was inserted and secured with the stay suture.  Pneumoperitoneum was then created with CO2 and tolerated well without any adverse changes in the patient's vital signs. An 5-mm port was placed in the subxiphoid position.  Two 5-mm ports were placed in the right upper quadrant. All skin incisions were infiltrated with a local anesthetic agent before making the incision and placing the trocars.   We positioned the patient in reverse Trendelenburg, tilted slightly to the patient's left.  The gallbladder was identified.  The gallbladder was distended.  In order to facilitate retraction I had to aspirate the gallbladder.  the fundus was grasped and retracted cephalad. Adhesions were lysed bluntly and with the electrocautery where indicated, taking care not to injure any adjacent organs or viscus. The infundibulum was grasped and retracted laterally, exposing the peritoneum overlying the triangle of Calot. This was then divided and exposed in a blunt fashion. A critical view of the cystic duct and cystic artery was obtained.  The cystic duct was clearly identified and bluntly dissected circumferentially.  Utilizing the Stryker camera system near infrared fluorescent activity was visualized in the liver, cystic duct, common hepatic duct and common bile duct, and small bowel.  This served as a secondary confirmation of our anatomy.  The cystic duct was then ligated with clips and divided. The cystic artery which had been identified & dissected free was ligated with clips and divided as well.   The gallbladder was dissected from the liver bed in retrograde fashion with the electrocautery. The gallbladder was removed and placed in an Ecco sac.  The liver bed was inspected. Hemostasis was achieved with the electrocautery. The gallbladder and Ecco sac were then  removed through the umbilical port site.  I did have to enlarge the fascial defect in order to extract the specimen due to a large gallstone.  The pursestring suture was used to close the umbilical fascia.  I did place 2 interrupted 0 Vicryl's using PMI suture passer with laparoscopic guidance  We again inspected the right upper quadrant for hemostasis.  The umbilical closure was inspected and there was no air leak and nothing trapped within the closure. Pneumoperitoneum was released as we removed the trocars.  4-0 Monocryl was used to close the skin.   Dermabond was applied. The patient was then extubated and brought to the recovery room in stable condition. Instrument, sponge, and needle counts were correct at closure and at the conclusion of the case.   Findings: Probable chronic cholecystitis with Cholelithiasis Positive critical view  Estimated Blood Loss: Minimal         Drains: none         Specimens: Gallbladder           Complications: None; patient tolerated the procedure well.         Disposition: PACU - hemodynamically stable.         Condition: stable  Mary Sella. Andrey Campanile, MD, FACS General, Bariatric, & Minimally Invasive Surgery [facility],  [facility]

## 2023-08-15 NOTE — Anesthesia Postprocedure Evaluation (Signed)
Anesthesia Post Note  Patient: Emily Hodges  Procedure(s) Performed: LAPAROSCOPIC CHOLECYSTECTOMY WITH ICG DYE     Patient location during evaluation: PACU Anesthesia Type: General Level of consciousness: awake and alert Pain management: pain level controlled Vital Signs Assessment: post-procedure vital signs reviewed and stable Respiratory status: spontaneous breathing, nonlabored ventilation and respiratory function stable Cardiovascular status: blood pressure returned to baseline and stable Postop Assessment: no apparent nausea or vomiting Anesthetic complications: no   No notable events documented.  Last Vitals:  Vitals:   08/15/23 1215 08/15/23 1315  BP: (!) 150/97 (!) 148/97  Pulse: 95 79  Resp: (!) 27   Temp:    SpO2: 99% 98%    Last Pain:  Vitals:   08/15/23 1315  TempSrc:   PainSc: 0-No pain                 Lowella Curb

## 2023-08-16 ENCOUNTER — Encounter (HOSPITAL_COMMUNITY): Payer: Self-pay | Admitting: General Surgery

## 2023-08-20 LAB — SURGICAL PATHOLOGY

## 2023-09-02 ENCOUNTER — Encounter: Payer: Medicaid Other | Admitting: Student

## 2023-09-20 ENCOUNTER — Encounter (HOSPITAL_BASED_OUTPATIENT_CLINIC_OR_DEPARTMENT_OTHER): Payer: Self-pay

## 2023-09-20 ENCOUNTER — Ambulatory Visit (HOSPITAL_BASED_OUTPATIENT_CLINIC_OR_DEPARTMENT_OTHER): Admit: 2023-09-20 | Payer: Self-pay | Admitting: Plastic Surgery

## 2023-09-20 SURGERY — MAMMOPLASTY, REDUCTION
Anesthesia: Choice | Laterality: Bilateral

## 2023-09-21 ENCOUNTER — Other Ambulatory Visit: Payer: Self-pay | Admitting: Student

## 2023-09-21 DIAGNOSIS — G8929 Other chronic pain: Secondary | ICD-10-CM

## 2023-09-23 ENCOUNTER — Encounter: Payer: Medicaid Other | Admitting: Student

## 2023-10-14 ENCOUNTER — Encounter: Payer: Medicaid Other | Admitting: Student

## 2023-10-28 ENCOUNTER — Encounter: Payer: Medicaid Other | Admitting: Plastic Surgery

## 2023-11-12 ENCOUNTER — Ambulatory Visit

## 2023-12-29 ENCOUNTER — Other Ambulatory Visit: Payer: Self-pay | Admitting: Student

## 2024-05-22 ENCOUNTER — Other Ambulatory Visit: Payer: Self-pay | Admitting: Student

## 2024-06-18 ENCOUNTER — Other Ambulatory Visit: Payer: Self-pay | Admitting: Student

## 2024-06-18 DIAGNOSIS — G8929 Other chronic pain: Secondary | ICD-10-CM

## 2024-07-08 ENCOUNTER — Encounter: Payer: Self-pay | Admitting: Family Medicine

## 2024-07-08 ENCOUNTER — Ambulatory Visit: Admitting: Family Medicine

## 2024-07-08 VITALS — HR 113 | Temp 99.3°F | Ht 59.0 in | Wt 173.4 lb

## 2024-07-08 DIAGNOSIS — J029 Acute pharyngitis, unspecified: Secondary | ICD-10-CM

## 2024-07-08 LAB — POC SOFIA 2 FLU + SARS ANTIGEN FIA
Influenza A, POC: NEGATIVE
Influenza B, POC: NEGATIVE
SARS Coronavirus 2 Ag: NEGATIVE

## 2024-07-08 LAB — POCT RAPID STREP A (OFFICE): Rapid Strep A Screen: NEGATIVE

## 2024-07-08 NOTE — Progress Notes (Signed)
    SUBJECTIVE:   CHIEF COMPLAINT / HPI:   Sore throat congestion and bodyaches since Monday.  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  Her throat has been quite sore and is her most bothersome complaint.  PERTINENT  PMH / PSH: Noncontributory  OBJECTIVE:   Pulse (!) 113   Temp 99.3 F (37.4 C) (Oral)   Ht 4' 11 (1.499 m)   Wt 173 lb 6 oz (78.6 kg)   SpO2 100%   BMI 35.02 kg/m   General: A&O, NAD HEENT: No sign of trauma, EOM grossly intact.  MMM, oropharyngeal mucosa is markedly erythematous but there is no tonsillar swelling or exudate. Cardiac: RRR, no m/r/g Respiratory: CTAB, normal WOB, no w/c/r GI: Soft, NTTP, non-distended   ASSESSMENT/PLAN:   Assessment & Plan Sore throat - Swabbed for COVID flu and strep - Will plan to treat based on results, patient is outside window for Tamiflu at this time so would not be an option -Advised supportive care - Work note provided     Lucie Pinal, DO [facility]

## 2024-07-08 NOTE — Patient Instructions (Signed)
 It was wonderful to see you today!  Most likely your symptoms are caused by a virus which there is no specific treatment for other than rest and supportive care.  Stay home until you feel well enough to return to work, drink plenty of fluids such as water , Gatorade, propel, Pedialyte and eat what you feel up to eating.  If you are going to use over-the-counter cold and flu remedies please read the labels to make sure that they do not contain acetaminophen  and if they do please avoid taking any extra Tylenol  on top of it.  You can alternate between Tylenol  and ibuprofen  for fever and bodyaches up to every 3 hours.  I have also provided a work excuse so that you may stay home for the rest of the day.  If you feel you need to stay out of work for longer and your work requires a further excuse, please feel free to reach out via MyChart or by phone and we will provide one for you.  Please call [phone] with any questions about today's appointment.   If you need any additional refills, please call your pharmacy before calling the office.  Lucie Pinal, DO Family Medicine
